# Patient Record
Sex: Female | Born: 1935 | Race: Black or African American | Hispanic: No | Smoking: Former smoker
Health system: Southern US, Community
[De-identification: ages and names within clinical notes are randomized; demographics above are authoritative.]

## PROBLEM LIST (undated history)

## (undated) DIAGNOSIS — I10 Essential (primary) hypertension: Secondary | ICD-10-CM

## (undated) DIAGNOSIS — R739 Hyperglycemia, unspecified: Secondary | ICD-10-CM

## (undated) DIAGNOSIS — I96 Gangrene, not elsewhere classified: Secondary | ICD-10-CM

## (undated) DIAGNOSIS — I739 Peripheral vascular disease, unspecified: Secondary | ICD-10-CM

## (undated) DIAGNOSIS — K5900 Constipation, unspecified: Secondary | ICD-10-CM

## (undated) DIAGNOSIS — E785 Hyperlipidemia, unspecified: Secondary | ICD-10-CM

## (undated) HISTORY — PX: JOINT REPLACEMENT: SHX530

## (undated) HISTORY — PX: MASTECTOMY: SHX3

## (undated) HISTORY — PX: TOTAL HIP ARTHROPLASTY: SHX124

---

## 2008-11-14 ENCOUNTER — Ambulatory Visit (HOSPITAL_COMMUNITY): Admission: RE | Admit: 2008-11-14 | Discharge: 2008-11-14 | Payer: Self-pay | Admitting: Orthopedic Surgery

## 2008-12-19 ENCOUNTER — Inpatient Hospital Stay (HOSPITAL_COMMUNITY): Admission: RE | Admit: 2008-12-19 | Discharge: 2008-12-21 | Payer: Self-pay | Admitting: Orthopedic Surgery

## 2010-10-15 LAB — CBC
HCT: 28.3 % — ABNORMAL LOW (ref 36.0–46.0)
HCT: 31.3 % — ABNORMAL LOW (ref 36.0–46.0)
HCT: 35.2 % — ABNORMAL LOW (ref 36.0–46.0)
Hemoglobin: 10.6 g/dL — ABNORMAL LOW (ref 12.0–15.0)
Hemoglobin: 11.9 g/dL — ABNORMAL LOW (ref 12.0–15.0)
Hemoglobin: 8.1 g/dL — ABNORMAL LOW (ref 12.0–15.0)
MCHC: 33.7 g/dL (ref 30.0–36.0)
MCHC: 34.6 g/dL (ref 30.0–36.0)
MCV: 91.3 fL (ref 78.0–100.0)
MCV: 91.2 fL (ref 78.0–100.0)
MCV: 92.4 fL (ref 78.0–100.0)
MCV: 92.9 fL (ref 78.0–100.0)
Platelets: 119 10*3/uL — ABNORMAL LOW (ref 150–400)
Platelets: 188 10*3/uL (ref 150–400)
RBC: 3.1 MIL/uL — ABNORMAL LOW (ref 3.87–5.11)
RBC: 2.53 MIL/uL — ABNORMAL LOW (ref 3.87–5.11)
RDW: 14.1 % (ref 11.5–15.5)
WBC: 6.1 10*3/uL (ref 4.0–10.5)
WBC: 6.5 10*3/uL (ref 4.0–10.5)

## 2010-10-15 LAB — COMPREHENSIVE METABOLIC PANEL
Albumin: 4.2 g/dL (ref 3.5–5.2)
Alkaline Phosphatase: 68 U/L (ref 39–117)
BUN: 14 mg/dL (ref 6–23)
CO2: 28 mEq/L (ref 19–32)
Chloride: 106 mEq/L (ref 96–112)
Creatinine, Ser: 1.03 mg/dL (ref 0.4–1.2)
GFR calc non Af Amer: 52 mL/min — ABNORMAL LOW (ref >60)
Glucose, Bld: 106 mg/dL — ABNORMAL HIGH (ref 70–99)
Potassium: 4.4 mEq/L (ref 3.5–5.1)
Total Bilirubin: 0.5 mg/dL (ref 0.3–1.2)

## 2010-10-15 LAB — CROSSMATCH

## 2010-10-15 LAB — PROTIME-INR
INR: 1 (ref 0.00–1.49)
Prothrombin Time: 13.3 seconds (ref 11.6–15.2)

## 2010-10-15 LAB — URINALYSIS, ROUTINE W REFLEX MICROSCOPIC
Bilirubin Urine: NEGATIVE
Ketones, ur: NEGATIVE mg/dL
Nitrite: NEGATIVE
Protein, ur: 30 mg/dL — AB
pH: 5.5 (ref 5.0–8.0)

## 2010-10-15 LAB — BASIC METABOLIC PANEL
CO2: 29 mEq/L (ref 19–32)
Chloride: 109 mEq/L (ref 96–112)
Chloride: 108 mEq/L (ref 96–112)
Creatinine, Ser: 0.84 mg/dL (ref 0.4–1.2)
GFR calc Af Amer: >60 mL/min (ref >60)
GFR calc Af Amer: >60 mL/min (ref >60)
GFR calc non Af Amer: >60 mL/min (ref >60)
Glucose, Bld: 122 mg/dL — ABNORMAL HIGH (ref 70–99)
Potassium: 3.7 mEq/L (ref 3.5–5.1)
Sodium: 141 mEq/L (ref 135–145)

## 2010-10-15 LAB — DIFFERENTIAL
Basophils Absolute: 0 10*3/uL (ref 0.0–0.1)
Basophils Relative: 0 % (ref 0–1)
Lymphocytes Relative: 24 % (ref 12–46)
Monocytes Absolute: 0.5 10*3/uL (ref 0.1–1.0)
Neutro Abs: 4.3 10*3/uL (ref 1.7–7.7)
Neutrophils Relative %: 66 % (ref 43–77)

## 2010-10-15 LAB — URINE MICROSCOPIC-ADD ON

## 2010-10-15 LAB — URINE CULTURE: Colony Count: 80000

## 2010-10-15 LAB — APTT: aPTT: 28 seconds (ref 24–37)

## 2010-10-16 LAB — URINE MICROSCOPIC-ADD ON

## 2010-10-16 LAB — URINE CULTURE

## 2010-10-16 LAB — URINALYSIS, ROUTINE W REFLEX MICROSCOPIC
Bilirubin Urine: NEGATIVE
Hgb urine dipstick: NEGATIVE
Specific Gravity, Urine: 1.007 (ref 1.005–1.030)
Urobilinogen, UA: 0.2 mg/dL (ref 0.0–1.0)

## 2010-11-20 NOTE — Op Note (Signed)
Lindsay Bentley, Lindsay Bentley                  ACCOUNT NO.:  [id number]   MEDICAL RECORD NO.:  [id number]          PATIENT TYPE:  INP   LOCATION:  5039                         FACILITY:  [facility]   PHYSICIAN:  Mila Homer. Sherlean Foot, M.D. DATE OF BIRTH:  [birth year]   DATE OF PROCEDURE:  12/19/2008  DATE OF DISCHARGE:                               OPERATIVE REPORT   SURGEON:  Mila Homer. Sherlean Foot, MD   ASSISTANTS:  1. Altamese Cabal, PA-C  2. Skip Mayer, PA-C   ANESTHESIA:  General.   PREOPERATIVE DIAGNOSIS:  Right hip osteoarthritis.   POSTOPERATIVE DIAGNOSIS:  Right hip osteoarthritis.   PROCEDURE:  Right total hip arthroplasty.   INDICATIONS FOR PROCEDURE:  The patient is a 77 year old African  American female with failure of conservative measures for osteoarthritis  of the right hip.  Informed consent obtained.   DESCRIPTION OF PROCEDURE:  The patient was laid supine, administered  general anesthesia, and placed in the left down, right up lateral  decubitus position.  Right leg was prepped and draped in the usual  sterile fashion.  Curvilinear incision was made with a #10 blade.  Nu-  Blade was used to incise the fascia lata along the length of the  incision.  Cautery dissection was used to obtain hemostasis.  I put a  Charnley retractor in place.  I then used the cautery to make the  incision bisecting the gluteus medius and vastus lateralis.  I then used  a 10 blade to elevate this in a single sleeve.  I placed a stay suture  in the lateralis, medius, and gluteus minimus.  I then placed a Hohmann  retractor beneath those and performed anterior hip capsulectomy with the  forceps and a Bovie.  I then externally rotated exposing the neck and  head.  I used the cutting guide to mark out the cut, made that cut with  a reciprocating saw.  I then removed the head and neck segment with the  large drill grasping device.  I then placed anterior and posterior  Hohmann retractors.  I then switched sides  of the table with my PAs.  I  removed the labrum circumferentially.  I then sequentially reamed up to  54 and placed in a 56-mm cup and placed 2 screws and I did discuss there  was weak bone and a couple of cysts that I had to pack with some reamer  bone graft.  I then placed a standard liner to receive a 32 head.  I  then went back to the back side of the table and flexed the knee and hip  placing the foot and leg into a sterile pouch off the anterior side of  the table.  I then used a Mueller retractor to deliver the cut surface  of the femoral neck up out of the wound and then sequentially reamed up  to 12 mm and broached to 12 and then trialed with 0 and -3.5 heads and  the 0 one gave excellent limb length, offset, and stability.  I then  removed trial components and placed in  a fully porous-coated size 12  stem, tamped on a clean +0 x 32-mm head and located the hip taking it  through another aggressive range of motion I had excellent stability.  I  repaired the medius minimus lateralis sleeve down to the trochanter  through drill holes and sutures.  I then closed the fascia lata with  running #1 Vicryls, deep soft tissues buried with 0 Vicryl, subcuticular  2-0 Vicryl stitch, and skin staples.  Dressed with Xeroform and Mepilex  dressing.   COMPLICATIONS:  None.   DRAINS:  None.   ESTIMATED BLOOD LOSS:  450 mL.           ______________________________  Mila Homer. Sherlean Foot, M.D.     SDL/MEDQ  D:  12/19/2008  T:  12/20/2008  Job:  [id number]

## 2010-11-23 NOTE — Discharge Summary (Signed)
Lindsay Bentley, Lindsay Bentley                  ACCOUNT NO.:  [id number]   MEDICAL RECORD NO.:  [id number]          PATIENT TYPE:  INP   LOCATION:  5039                         FACILITY:  [facility]   PHYSICIAN:  Mila Homer. Sherlean Foot, M.D. DATE OF BIRTH:  [birth year]   DATE OF ADMISSION:  12/19/2008  DATE OF DISCHARGE:  12/21/2008                               DISCHARGE SUMMARY   ADMISSION DIAGNOSIS:  Osteoarthritis of the right hip.   DISCHARGE DIAGNOSES:  1. Osteoarthritis of the right hip.  2. Status post total hip arthroplasty, right hip.  3. Acute blood loss anemia, status post surgery.   PROCEDURE:  Right total hip arthroplasty.   HISTORY:  The patient is a 77 year old female with constant severe hip  pain interfering with activities of daily living, and conservative  treatment has failed.  Risk and benefits of surgery were discussed with  the patient and the patient would like to proceed with a right THA.   ALLERGIES:  The patient was allergic to:  1. PERCOCET.  2. OXYCODONE.   On admission, the patient is taking Tylenol 1-2 a day and atenolol 50 mg  daily.   HOSPITAL COURSE:  This is a 77 year old female admitted on December 19, 2008, after appropriate laboratory studies were obtained preoperatively  as well as Ancef.  On-call to the operating room, she was taken to the  OR where she underwent a right THA.  She tolerated the procedure well  and was taken to the PACU in good condition.  Foley was placed  intraoperatively and she was placed on IV and p.o. pain medications.   On postop day #1, vital signs were stable so for.  She was little  hypotensive.  Her blood pressure medicine was held.  The patient denied  shortness of breath, chest pain or calf pain.  The patient was started  on Lovenox 30 mg subcu q.12 h. at a.m.   Consults for PT/OT, and Care Management were made.  The patient is  weightbearing as tolerated.  Incentive spirometry was taught.  The  patient's blood level had dropped  to 8.1 and was transfused 2 units.   On postop day #2, the patient has continued progress with physical  therapy.  Her H and H was up to 9.5 and 28.3.  She was asymptomatic.  Foley was discontinued.  The patient was continued on pain medication.  The patient was discharged home after Lovenox teaching.   LABORATORY STUDIES:  Upon admission to the hospital, the patient's white  blood cell count was 6.5, H and H was 11.9 and 35.2, and platelets are  188.  Sodium was 138, potassium was 4.4, chloride was 106, CO2 was 28,  glucose was 106, BUN was 14, and creatinine was 1.02.  Upon discharge;  white blood cell count was 6.1, H and H was 9.5 and 28.3, and 119 of  platelets.  Sodium was 139, potassium was 3.7, chloride was 109, CO2 was  25, glucose was 129, BUN was 11, and creatinine was 0.84.   DISCHARGE INSTRUCTIONS:  No restrictions to diet.  She  is to follow the  blue instruction sheet.  For wound care, she is to increase activity  slowly.  Use a cane or a walker, weightbearing as tolerated.  No lifting  or driving for 6 weeks.  She is to have home health.   DISCHARGE MEDICATIONS:  Upon discharge from the hospital, she was given  prescriptions for:  1. Lovenox 40 mg inject one subcu daily, last dose on January 02, 2009.  2. Robaxin 500 mg 1-2 tablets every 6-8 hours as needed for spasm.  3. Oxycodone 5 mg 1-2 tablets every 4-6 hours as needed for pain.   FOLLOWUP:  She is to follow up in 2 weeks with Dr. Sherlean Foot on either in  [place] on January 02, 2009, or in [place] on January 03, 2009.  She is  to call for appointment, either [phone] or [phone].   The patient was discharged in improved condition.     ______________________________  Altamese Cabal, PA-C    ______________________________  Mila Homer. Sherlean Foot, M.D.    MJ/MEDQ  D:  02/03/2009  T:  02/04/2009  Job:  [id number]

## 2013-04-02 ENCOUNTER — Inpatient Hospital Stay (HOSPITAL_COMMUNITY)
Admission: EM | Admit: 2013-04-02 | Discharge: 2013-04-08 | DRG: 300 | Disposition: A | Discharge status: Skilled Nursing Facility | Payer: Medicare PPO | Attending: Internal Medicine | Admitting: Internal Medicine

## 2013-04-02 ENCOUNTER — Emergency Department (HOSPITAL_COMMUNITY): Payer: Medicare PPO

## 2013-04-02 ENCOUNTER — Encounter (HOSPITAL_COMMUNITY): Payer: Self-pay | Admitting: Emergency Medicine

## 2013-04-02 DIAGNOSIS — I10 Essential (primary) hypertension: Secondary | ICD-10-CM | POA: Diagnosis present

## 2013-04-02 DIAGNOSIS — F17200 Nicotine dependence, unspecified, uncomplicated: Secondary | ICD-10-CM | POA: Diagnosis present

## 2013-04-02 DIAGNOSIS — I739 Peripheral vascular disease, unspecified: Secondary | ICD-10-CM | POA: Diagnosis present

## 2013-04-02 DIAGNOSIS — I96 Gangrene, not elsewhere classified: Principal | ICD-10-CM | POA: Diagnosis present

## 2013-04-02 DIAGNOSIS — L02212 Cutaneous abscess of back [any part, except buttock]: Secondary | ICD-10-CM

## 2013-04-02 DIAGNOSIS — K5900 Constipation, unspecified: Secondary | ICD-10-CM | POA: Diagnosis present

## 2013-04-02 DIAGNOSIS — Z87440 Personal history of urinary (tract) infections: Secondary | ICD-10-CM

## 2013-04-02 DIAGNOSIS — Z96649 Presence of unspecified artificial hip joint: Secondary | ICD-10-CM

## 2013-04-02 DIAGNOSIS — Z720 Tobacco use: Secondary | ICD-10-CM | POA: Diagnosis present

## 2013-04-02 DIAGNOSIS — L97519 Non-pressure chronic ulcer of other part of right foot with unspecified severity: Secondary | ICD-10-CM

## 2013-04-02 DIAGNOSIS — L02219 Cutaneous abscess of trunk, unspecified: Secondary | ICD-10-CM | POA: Diagnosis present

## 2013-04-02 HISTORY — DX: Essential (primary) hypertension: I10

## 2013-04-02 HISTORY — DX: Gangrene, not elsewhere classified: I96

## 2013-04-02 LAB — URINALYSIS, ROUTINE W REFLEX MICROSCOPIC
Bilirubin Urine: NEGATIVE
Glucose, UA: NEGATIVE mg/dL
Hgb urine dipstick: NEGATIVE
Ketones, ur: NEGATIVE mg/dL
Nitrite: NEGATIVE
Protein, ur: 100 mg/dL — AB
Specific Gravity, Urine: 1.012 (ref 1.005–1.030)
Urobilinogen, UA: 0.2 mg/dL (ref 0.0–1.0)

## 2013-04-02 LAB — CBC WITH DIFFERENTIAL/PLATELET
Basophils Relative: 0 % (ref 0–1)
Eosinophils Absolute: 0.2 10*3/uL (ref 0.0–0.7)
Eosinophils Relative: 2 % (ref 0–5)
HCT: 32.6 % — ABNORMAL LOW (ref 36.0–46.0)
Hemoglobin: 10.6 g/dL — ABNORMAL LOW (ref 12.0–15.0)
Lymphocytes Relative: 15 % (ref 12–46)
Lymphs Abs: 1.1 10*3/uL (ref 0.7–4.0)
MCH: 30.2 pg (ref 26.0–34.0)
MCHC: 32.5 g/dL (ref 30.0–36.0)
MCV: 92.9 fL (ref 78.0–100.0)
Monocytes Absolute: 0.6 10*3/uL (ref 0.1–1.0)
Monocytes Relative: 9 % (ref 3–12)
Neutrophils Relative %: 75 % (ref 43–77)
Platelets: 178 10*3/uL (ref 150–400)
RBC: 3.51 MIL/uL — ABNORMAL LOW (ref 3.87–5.11)

## 2013-04-02 LAB — POCT I-STAT, CHEM 8
BUN: 19 mg/dL (ref 6–23)
Calcium, Ion: 1.3 mmol/L (ref 1.13–1.30)
Creatinine, Ser: 1.1 mg/dL (ref 0.50–1.10)
TCO2: 27 mmol/L (ref 0–100)

## 2013-04-02 LAB — URINE MICROSCOPIC-ADD ON

## 2013-04-02 MED ORDER — ACETAMINOPHEN 325 MG PO TABS: 650.0000 mg | ORAL_TABLET | Freq: Four times a day (QID) | ORAL | Status: DC | PRN

## 2013-04-02 MED ORDER — ATENOLOL 25 MG PO TABS
25.0000 mg | ORAL_TABLET | Freq: Every day | ORAL | Status: DC
  Administered 2013-04-02 – 2013-04-08 (×7): 25 mg via ORAL
  Filled 2013-04-02 (×8): qty 1

## 2013-04-02 MED ORDER — ASPIRIN EC 81 MG PO TBEC
81.0000 mg | DELAYED_RELEASE_TABLET | Freq: Every day | ORAL | Status: DC
  Administered 2013-04-02 – 2013-04-08 (×7): 81 mg via ORAL
  Filled 2013-04-02 (×8): qty 1

## 2013-04-02 MED ORDER — INFLUENZA VAC SPLIT QUAD 0.5 ML IM SUSP
0.5000 mL | INTRAMUSCULAR | Status: AC
  Administered 2013-04-03: 12:00:00 0.5 mL via INTRAMUSCULAR
  Filled 2013-04-02: qty 0.5

## 2013-04-02 MED ORDER — ACETAMINOPHEN 650 MG RE SUPP: 650.0000 mg | Freq: Four times a day (QID) | RECTAL | Status: DC | PRN

## 2013-04-02 MED ORDER — NITROGLYCERIN 0.4 MG SL SUBL: 0.4000 mg | SUBLINGUAL_TABLET | SUBLINGUAL | Status: DC | PRN

## 2013-04-02 MED ORDER — ENOXAPARIN SODIUM 40 MG/0.4ML ~~LOC~~ SOLN
40.0000 mg | SUBCUTANEOUS | Status: DC
  Administered 2013-04-02 – 2013-04-05 (×4): 40 mg via SUBCUTANEOUS
  Filled 2013-04-02 (×6): qty 0.4

## 2013-04-02 NOTE — ED Notes (Signed)
PCP this am for evaluation of necrotic right foot and cyst on back. Sent to ED for same, NAD. PCP stated no circulation in right toes

## 2013-04-02 NOTE — Progress Notes (Signed)
04/02/13 Patient admitted from ED to Rm 5w08 with dx of foot ulcers no order written call placed to physician at 1630. Made comfortable in room with daughter assisting

## 2013-04-02 NOTE — ED Provider Notes (Signed)
CSN: [id number]     Arrival date & time 04/02/13  1129 History   First MD Initiated Contact with Patient 04/02/13 1234     Chief Complaint  Patient presents with  . Foot Injury  . cyst drainage    (Consider location/radiation/quality/duration/timing/severity/associated sxs/prior Treatment) Patient is a 79 y.o. female presenting with foot injury. The history is provided by the patient.  Foot Injury Associated symptoms: no back pain    patient was sent in by her primary care Dr. for infection on her right foot. Patient said his been going on for a while. She was seen 2 weeks ago for the same thing. She also had a cyst on her back that has been incised and drained with packing. Packing was removed today. She denies fever. She has chills. Patient states she was told there was no blood flow in her foot. She's also been on Cipro for urinary tract infection.  Past Medical History  Diagnosis Date  . Hypertension   . Gangrene of foot    Past Surgical History  Procedure Laterality Date  . Total hip arthroplasty Right   . Mastectomy Bilateral   . Joint replacement     History reviewed. No pertinent family history. History  Substance Use Topics  . Smoking status: Heavy Tobacco Smoker -- 0.50 packs/day for 50 years    Types: Cigarettes  . Smokeless tobacco: Never Used  . Alcohol Use: No   OB History   Grav Para Term Preterm Abortions TAB SAB Ect Mult Living                 Review of Systems  Constitutional: Negative for activity change and appetite change.  HENT: Negative for neck stiffness.   Eyes: Negative for pain.  Respiratory: Negative for chest tightness and shortness of breath.   Cardiovascular: Negative for chest pain and leg swelling.  Gastrointestinal: Negative for nausea, vomiting, abdominal pain and diarrhea.  Genitourinary: Negative for flank pain.  Musculoskeletal: Negative for back pain.  Skin: Positive for wound. Negative for rash.  Neurological: Negative for  weakness, numbness and headaches.  Psychiatric/Behavioral: Negative for behavioral problems.    Allergies  Review of patient's allergies indicates no known allergies.  Home Medications  No current outpatient prescriptions on file. BP 140/57  Pulse 69  Temp(Src) 99 F (37.2 C) (Oral)  Resp 18  Ht 5\' 3"  (1.6 m)  Wt 148 lb 9.4 oz (67.4 kg)  BMI 26.33 kg/m2  SpO2 96% Physical Exam  Nursing note and vitals reviewed. Constitutional: She is oriented to person, place, and time. She appears well-developed and well-nourished.  HENT:  Head: Normocephalic and atraumatic.  Eyes: EOM are normal. Pupils are equal, round, and reactive to light.  Neck: Normal range of motion. Neck supple.  Cardiovascular: Normal rate, regular rhythm and normal heart sounds.   No murmur heard. Pulmonary/Chest: Effort normal and breath sounds normal. No respiratory distress. She has no wheezes. She has no rales.  Abdominal: Soft. Bowel sounds are normal. She exhibits no distension. There is no tenderness. There is no rebound and no guarding.  Musculoskeletal: Normal range of motion.  Large ulcers to right third and fourth toe. Decreased sensation of fourth toe. Some edema of foot and lower leg. There is a palpable, but weak dorsalis pedis pulse. There is a draining cyst with purulent drainage on her left upper back. It is approximately 5 cm in  Neurological: She is alert and oriented to person, place, and time. No cranial nerve  deficit.  Skin: Skin is warm and dry.  Psychiatric: She has a normal mood and affect. Her speech is normal.    ED Course  Procedures (including critical care time) Labs Review Labs Reviewed  CBC WITH DIFFERENTIAL - Abnormal; Notable for the following:    RBC 3.51 (*)    Hemoglobin 10.6 (*)    HCT 32.6 (*)    All other components within normal limits  URINALYSIS, ROUTINE W REFLEX MICROSCOPIC - Abnormal; Notable for the following:    Protein, ur 100 (*)    Leukocytes, UA MODERATE  (*)    All other components within normal limits  POCT I-STAT, CHEM 8 - Abnormal; Notable for the following:    Glucose, Bld 220 (*)    Hemoglobin 11.6 (*)    HCT 34.0 (*)    All other components within normal limits  URINE CULTURE  URINE MICROSCOPIC-ADD ON  COMPREHENSIVE METABOLIC PANEL   Imaging Review Dg Foot Complete Right  04/02/2013   CLINICAL DATA:  Foot injury. Non healing wound between 3rd and 4th toes  EXAM: RIGHT FOOT COMPLETE - 3+ VIEW  COMPARISON:  None.  FINDINGS: Normal alignment. Negative for fracture. No evidence of osteomyelitis.  IMPRESSION: No acute abnormality.   Electronically Signed   By: Marlan Palau M.D.   On: 04/02/2013 12:50    MDM   1. Foot ulcer, right, with unspecified severity   2. Back abscess    Patient with foot ulcers. Does have pulse in the foot but will need further evaluation. Also an abscess on the back. Has been on antibiotics. Will admit to    [facility]. Rubin Payor, MD 04/02/13 2056

## 2013-04-02 NOTE — H&P (Signed)
Date: 04/03/2013               Patient Name:  Lindsay Bentley MRN: [id number]  DOB: [birth year] Age / Sex: 79 y.o., female   PCP: Abigail Miyamoto, MD         Medical Service: Internal Medicine Teaching Service         Attending Physician: Dr. Dalphine Handing    First Contact: Dr. Claudell Kyle Pager: [phone]  Second Contact: Dr. Dorise Hiss Pager: [phone]       After Hours (After 5p/  First Contact Pager: [phone]  weekends / holidays): Second Contact Pager: [phone]   Chief Complaint: right foot "wounds"  History of Present Illness:  Lindsay Bentley is a 79 year old woman with history of back abscess s/p recent I&D by PCP, HTN, HL, tobacco abuse who presents from PCP (Dr. Marina Goodell) office with "wounds" on right 3rd and 4th toes x 2 weeks and concern for ischemia.   Patient states that about two weeks ago she starting noticing that some of the toes on her right foot became dark and bloody.  She has not noticed any puss or foul smell, denies fever. She went to see her PCP today who told her he was worried there wasn't enough blood flow to her feet.   She also had an abscess on her upper left back s/p I&D, culture pending.  She has ?completed course of abx. She gets her medications in the mail [facility].    She smokes 1/2 PPD, no alcohol or illicits.  She lives at home with her granddaughter and two great-granddaughters.    On ROS, denies fever/chills, lightheadedness, numbness/tingling. She endorses chronic constipation- last BM on 9/25, no blood.  Denies dysuria, frequency, hematuria; she was recently treated for UTI with cipro.  Her legs have also been cramping some when she is trying to sleep.    Meds: Current Facility-Administered Medications  Medication Dose Route Frequency Provider Last Rate Last Dose  . acetaminophen (TYLENOL) tablet 650 mg  650 mg Oral Q6H PRN Annett Gula, MD       Or  . acetaminophen (TYLENOL) suppository 650 mg  650 mg Rectal Q6H PRN Annett Gula, MD      . aspirin EC  tablet 81 mg  81 mg Oral Daily Annett Gula, MD   81 mg at 04/02/13 2327  . atenolol (TENORMIN) tablet 25 mg  25 mg Oral Daily Annett Gula, MD   25 mg at 04/02/13 2329  . enoxaparin (LOVENOX) injection 40 mg  40 mg Subcutaneous Q24H Annett Gula, MD   40 mg at 04/02/13 2329  . influenza vac split quadrivalent PF (FLUARIX) injection 0.5 mL  0.5 mL Intramuscular Tomorrow-1000 Inez Catalina, MD      . nitroGLYCERIN (NITROSTAT) SL tablet 0.4 mg  0.4 mg Sublingual Q5 min PRN Annett Gula, MD      . polyethylene glycol (MIRALAX / GLYCOLAX) packet 17 g  17 g Oral Daily PRN Annett Gula, MD        Allergies: Allergies as of 04/02/2013  . (No Known Allergies)   Past Medical History  Diagnosis Date  . Hypertension   . Gangrene of foot    Past Surgical History  Procedure Laterality Date  . Total hip arthroplasty Right   . Mastectomy Bilateral   . Joint replacement     History reviewed. No pertinent family history. History   Social History  . Marital Status: Widowed  Spouse Name: N/A    Number of Children: N/A  . Years of Education: N/A   Occupational History  . Not on file.   Social History Main Topics  . Smoking status: Heavy Tobacco Smoker -- 0.50 packs/day for 50 years    Types: Cigarettes  . Smokeless tobacco: Never Used  . Alcohol Use: No  . Drug Use: No  . Sexual Activity: No   Other Topics Concern  . Not on file   Social History Narrative  . No narrative on file    Review of Systems: Review of Systems  Constitutional: Negative for fever, chills and malaise/fatigue.  HENT: Negative for sore throat.   Eyes: Negative for blurred vision.  Respiratory: Negative for cough and shortness of breath.   Cardiovascular: Negative for chest pain, palpitations and leg swelling.  Gastrointestinal: Positive for constipation. Negative for nausea, vomiting, abdominal pain and blood in stool.  Genitourinary: Negative for dysuria.  Musculoskeletal: Positive for joint  pain. Negative for falls.  Skin: Negative for rash.  Neurological: Negative for dizziness, tingling, focal weakness, weakness and headaches.    Physical Exam: Blood pressure 140/57, pulse 69, temperature 99 F (37.2 C), temperature source Oral, resp. rate 18, height 5\' 3"  (1.6 m), weight 148 lb 9.4 oz (67.4 kg), SpO2 96.00%. General: alert, cooperative, and in no apparent distress HEENT: NCAT, pupils equal round and reactive to light, vision grossly intact, oropharynx clear and non-erythematous  Neck: supple, no lymphadenopathy, JVD, or carotid bruits Lungs: clear to ascultation bilaterally, normal work of respiration, no wheezes, rales, ronchi Back: upper left back abscess now covered with wound care bandage Heart: regular rate and rhythm, no murmurs, gallops, or rubs Abdomen: soft, non-tender, non-distended, normal bowel sounds Extremities: large wounds on distal 3rd and 4th toes of right foot, bloody, foul smelling, small amount of purulent drainage, some edema on dorsal foot with shiny skin but no erythema; no wounds or skin breakdown appreciated on left foot; warm to touch bilaterally, 1+ DP pulse on left, ?palpable DP pulse on right, hair loss on bilateral feet, no cyanosis, clubbing Neurologic: alert & oriented X3, cranial nerves II-XII intact, strength grossly intact, sensation intact to light touch   Lab results: Basic Metabolic Panel:  Recent Labs  40/98/11 1221  NA 140  K 4.4  CL 103  GLUCOSE 220*  BUN 19  CREATININE 1.10   CBC:  Recent Labs  04/02/13 1221 04/02/13 1250  WBC  --  7.2  NEUTROABS  --  5.3  HGB 11.6* 10.6*  HCT 34.0* 32.6*  MCV  --  92.9  PLT  --  178   Urinalysis:  Recent Labs  04/02/13 1407  COLORURINE YELLOW  LABSPEC 1.012  PHURINE 6.5  GLUCOSEU NEGATIVE  HGBUR NEGATIVE  BILIRUBINUR NEGATIVE  KETONESUR NEGATIVE  PROTEINUR 100*  UROBILINOGEN 0.2  NITRITE NEGATIVE  LEUKOCYTESUR MODERATE*    Imaging results:  Dg Foot Complete  Right  04/02/2013   CLINICAL DATA:  Foot injury. Non healing wound between 3rd and 4th toes  EXAM: RIGHT FOOT COMPLETE - 3+ VIEW  COMPARISON:  None.  FINDINGS: Normal alignment. Negative for fracture. No evidence of osteomyelitis.  IMPRESSION: No acute abnormality.   Electronically Signed   By: Marlan Palau M.D.   On: 04/02/2013 12:50    Assessment & Plan by Problem: #Gangrene of right 3rd and 4th toes- appears to be wet gangrene with blood and ?puss seen on exam.  Xray of right foot showed no fracture or evidence  of osteomyelitis.  Pt did not meet any SIRS criteria at admission (no fever, tachycardia, tachypnea, or leucocytosis (WBC 7.2)); could consider initiation of abx in AM or after vascular consult. Pt currently lives with her granddaughter who helps care for her but may need placement at SNF for assistance.  -consult to vascular surgery  -neurovascular checks q4h -acetominophen prn pain -wound care consult -social work consult -PT/OT consult -A1C, CMP in AM  #History of abscess on upper left back- s/p I&D with PCP, culture pending.  Packing reportedly removed at 9/25 PCP visit.  No erythema appreciated.   -wound care consult -continue to monitor  #Possible UTI- Pt recently treated for UTI with cipro (time course unclear).  9/26 UA with moderate leukesterase.  Denies fever, dysuria.  -urine culture pending  #HTN- stable BP 140s/50s -continue home meds (atenolol)  #HL- on pravastatin at home  #Tobacco abuse- declined patch -smoking cessation counseling  #DVT PPX- lovenox  #Code status- Full code  Dispo: Disposition is deferred at this time, awaiting improvement of current medical problems. Anticipated discharge in approximately 2-3 day(s).   The patient does have a current PCP Abigail Miyamoto, MD) and does need an [facility] hospital follow-up appointment after discharge.   Signed: Rocco Serene, MD 04/03/2013, 1:36 AM

## 2013-04-03 DIAGNOSIS — I70269 Atherosclerosis of native arteries of extremities with gangrene, unspecified extremity: Secondary | ICD-10-CM

## 2013-04-03 LAB — COMPREHENSIVE METABOLIC PANEL
Albumin: 3.3 g/dL — ABNORMAL LOW (ref 3.5–5.2)
Alkaline Phosphatase: 63 U/L (ref 39–117)
BUN: 14 mg/dL (ref 6–23)
CO2: 25 mEq/L (ref 19–32)
Chloride: 102 mEq/L (ref 96–112)
GFR calc Af Amer: 56 mL/min — ABNORMAL LOW (ref >90)
GFR calc non Af Amer: 48 mL/min — ABNORMAL LOW (ref >90)
Glucose, Bld: 94 mg/dL (ref 70–99)
Potassium: 4.3 mEq/L (ref 3.5–5.1)
Total Bilirubin: 0.4 mg/dL (ref 0.3–1.2)

## 2013-04-03 LAB — HEMOGLOBIN A1C
Hgb A1c MFr Bld: 6.3 % — ABNORMAL HIGH (ref <5.7)
Mean Plasma Glucose: 134 mg/dL — ABNORMAL HIGH (ref <117)

## 2013-04-03 MED ORDER — NICOTINE 14 MG/24HR TD PT24
14.0000 mg | MEDICATED_PATCH | Freq: Every day | TRANSDERMAL | Status: DC
  Administered 2013-04-03 – 2013-04-08 (×6): 14 mg via TRANSDERMAL
  Filled 2013-04-03 (×6): qty 1

## 2013-04-03 MED ORDER — SENNOSIDES-DOCUSATE SODIUM 8.6-50 MG PO TABS
1.0000 | ORAL_TABLET | Freq: Two times a day (BID) | ORAL | Status: DC
  Administered 2013-04-03 – 2013-04-04 (×4): 1 via ORAL
  Filled 2013-04-03 (×3): qty 1

## 2013-04-03 MED ORDER — HYDROCODONE-ACETAMINOPHEN 5-325 MG PO TABS
1.0000 | ORAL_TABLET | ORAL | Status: DC | PRN
  Administered 2013-04-03 – 2013-04-05 (×5): 1 via ORAL
  Filled 2013-04-03 (×5): qty 1

## 2013-04-03 MED ORDER — SODIUM CHLORIDE 0.9 % IJ SOLN
3.0000 mL | Freq: Two times a day (BID) | INTRAMUSCULAR | Status: DC
  Administered 2013-04-03 – 2013-04-06 (×5): 3 mL via INTRAVENOUS

## 2013-04-03 MED ORDER — POLYETHYLENE GLYCOL 3350 17 G PO PACK
17.0000 g | PACK | Freq: Every day | ORAL | Status: DC | PRN
  Filled 2013-04-03: qty 1

## 2013-04-03 MED ORDER — VANCOMYCIN HCL IN DEXTROSE 1-5 GM/200ML-% IV SOLN
1000.0000 mg | INTRAVENOUS | Status: DC
  Administered 2013-04-03 – 2013-04-06 (×4): 1000 mg via INTRAVENOUS
  Filled 2013-04-03 (×7): qty 200

## 2013-04-03 NOTE — Progress Notes (Signed)
ANTIBIOTIC CONSULT NOTE - INITIAL  Pharmacy Consult for Vancomycin Indication: Right foot wounds  No Known Allergies  Patient Measurements: Height: 5\' 3"  (160 cm) Weight: 148 lb 9.4 oz (67.4 kg) IBW/kg (Calculated) : 52.4  Vital Signs: Temp: 99.8 F (37.7 C) (09/27 0529) Temp src: Oral (09/27 0529) BP: 112/62 mmHg (09/27 0529) Pulse Rate: 60 (09/27 0529) Intake/Output from previous day: 09/26 0701 - 09/27 0700 In: 240 [P.O.:240] Out: -   Labs:  Recent Labs  04/02/13 1221 04/02/13 1250 04/03/13 0544  WBC  --  7.2  --   HGB 11.6* 10.6*  --   PLT  --  178  --   CREATININE 1.10  --  1.05   Estimated Creatinine Clearance: 38.1 ml/min (by C-G formula based on Cr of 1.05). No results found for this basename: VANCOTROUGH, VANCOPEAK, VANCORANDOM, GENTTROUGH, GENTPEAK, GENTRANDOM, TOBRATROUGH, TOBRAPEAK, TOBRARND, AMIKACINPEAK, AMIKACINTROU, AMIKACIN,  in the last 72 hours   Microbiology: No results found for this or any previous visit (from the past 720 hour(s)).  Medical History: Past Medical History  Diagnosis Date  . Hypertension   . Gangrene of foot     Medications:  Prescriptions prior to admission  Medication Sig Dispense Refill  . aspirin 81 MG tablet Take 81 mg by mouth daily.      Marland Kitchen atenolol (TENORMIN) 25 MG tablet Take 25 mg by mouth daily.      . nitroGLYCERIN (NITROSTAT) 0.4 MG SL tablet Place 0.4 mg under the tongue every 5 (five) minutes as needed for chest pain.      . pravastatin (PRAVACHOL) 10 MG tablet Take 10 mg by mouth daily.       Assessment: 79 yo F with h/o back abscess s/p recent I&D, HTN, HLD, tobacco abuse presenting on 9/26 with "wounds" on right 3rd and 4th toes x 2 weeks and concern for ischemia.  Pharmacy consulted to start vancomycin. Xray showed no signs of OM, does not meet SIRS criteria. Afebrile, WBC wnl (7.2), SCr 1.05, CrCl ~44   Goal of Therapy:  Vancomycin trough level 10-15 mcg/ml  Plan:  - Vanc 1g IV q24h - f/u temp,  WBC, C&S, levels prn   Margie Billet, PharmD Clinical Pharmacist - Resident Pager: [phone] Pharmacy: [phone] 04/03/2013 8:54 AM

## 2013-04-03 NOTE — H&P (Signed)
I saw and evaluated the patient.  I personally confirmed the key portions of the history and exam documented by Dr. Aundria Rud and Dr. Dorise Hiss and I reviewed pertinent patient test results.  The assessment, diagnosis, and plan were formulated together, with my additions below, and I agree with the documentation in the resident's note.   In brief, this is an 79 year old woman with history of hypertension, hyperlipidemia, who has recently had a back abscess drained and presented with gangrenous right foot 3rd and 4th toes for the past 2-3 weeks, with no history of claudication. She reports considerable pain in that feet. She denies chest pain, palpitations, fever, or chills, numbness or tingling in feet. She is a current smoker.   On exam - her right foot wound affects the 3rd and 4th fingers and the interdigital space. The wound is black in color, with very little putrid fowl smelling discharge. It is not deep. Macerated skin surrounds the wound. There is no evidence of cellulitis. Could not feel pedal pulses bilaterally. Blood work has been reviewed and does not show any signs of systemic infection. X Ray reviewed.   Right now the patient is being treated with Vancomycin IV for probable osteomyelitis, we are trying to give adequate pain control with opiates, Vascular Surgery has been consulted and they have advised conservative management and limb salvage, with toe amputaion. We have discussed smoking cessation with the patient. We will obtain a lipid panel and ABI as per vascular surgery suggestion.

## 2013-04-03 NOTE — Progress Notes (Signed)
Subjective: The patient is a very pleasant 79 YO woman who came in to the hospital with toe injury. Her daughter adds that the injury happened about 2-3 weeks ago although the cause is uncertain. She has had no improvement of the area and it has worsened. They have not noticed any discharge at home. She is not having any complaints this morning except mild pain in her foot. She denies chest pain, fevers, SOB, nausea, vomiting. She has last moved her bowels Tuesday. She is moving gas but would like something to help that out. She was supposed to get prune juice last night but did not get it.   Objective: Vital signs in last 24 hours: Filed Vitals:   04/02/13 1632 04/02/13 1642 04/03/13 0529 04/03/13 0936  BP: 140/57  112/62 122/67  Pulse: 69  60 64  Temp: 99 F (37.2 C)  99.8 F (37.7 C)   TempSrc: Oral  Oral   Resp: 18  16   Height:  5\' 3"  (1.6 m)    Weight:  148 lb 9.4 oz (67.4 kg)    SpO2: 96%  97%    Weight change:   Intake/Output Summary (Last 24 hours) at 04/03/13 1021 Last data filed at 04/02/13 1842  Gross per 24 hour  Intake    240 ml  Output      0 ml  Net    240 ml   General: resting in bed, comfortable HEENT: PERRL, EOMI, no scleral icterus Cardiac: S1 S2 normal Pulm: clear to auscultation bilaterally, moving normal volumes of air Abd: soft, nontender, nondistended, BS present Ext: warm with wound on 3/4th toes on the right foot with necrotic tissue and some minimally purulent area where the interdigitation occurs. No surrounding erythema or warmth, minimally palpable pulse on the left foot DP but no to minimal pulse on the right DP Neuro: alert and oriented X3, cranial nerves II-XII grossly intact  Lab Results: Basic Metabolic Panel:  Recent Labs Lab 04/02/13 1221 04/03/13 0544  NA 140 137  K 4.4 4.3  CL 103 102  CO2  --  25  GLUCOSE 220* 94  BUN 19 14  CREATININE 1.10 1.05  CALCIUM  --  9.7   Liver Function Tests:  Recent Labs Lab 04/03/13 0544    AST 16  ALT 8  ALKPHOS 63  BILITOT 0.4  PROT 6.7  ALBUMIN 3.3*   CBC:  Recent Labs Lab 04/02/13 1221 04/02/13 1250  WBC  --  7.2  NEUTROABS  --  5.3  HGB 11.6* 10.6*  HCT 34.0* 32.6*  MCV  --  92.9  PLT  --  178   Urinalysis:  Recent Labs Lab 04/02/13 1407  COLORURINE YELLOW  LABSPEC 1.012  PHURINE 6.5  GLUCOSEU NEGATIVE  HGBUR NEGATIVE  BILIRUBINUR NEGATIVE  KETONESUR NEGATIVE  PROTEINUR 100*  UROBILINOGEN 0.2  NITRITE NEGATIVE  LEUKOCYTESUR MODERATE*   Studies/Results: Dg Foot Complete Right  04/02/2013   CLINICAL DATA:  Foot injury. Non healing wound between 3rd and 4th toes  EXAM: RIGHT FOOT COMPLETE - 3+ VIEW  COMPARISON:  None.  FINDINGS: Normal alignment. Negative for fracture. No evidence of osteomyelitis.  IMPRESSION: No acute abnormality.   Electronically Signed   By: Marlan Palau M.D.   On: 04/02/2013 12:50   Medications: I have reviewed the patient's current medications. Scheduled Meds: . aspirin EC  81 mg Oral Daily  . atenolol  25 mg Oral Daily  . enoxaparin (LOVENOX) injection  40 mg  Subcutaneous Q24H  . influenza vac split quadrivalent PF  0.5 mL Intramuscular Tomorrow-1000  . vancomycin  1,000 mg Intravenous Q24H   Continuous Infusions:  PRN Meds:.HYDROcodone-acetaminophen, nitroGLYCERIN, polyethylene glycol Assessment/Plan:  Necrotic wound - Per vascular surgery likely converting to dry gangrene. She likely has PVD and awaiting ABI. They are contemplating cosmetic sparing procedure however they would like to weight risks and benefits. She will likely need a surgical intervention and agree that the problem likely affects the bone. Will continue Vancomycin although if diabetic (HgA1c pending but glucose 220 on admit) then may need to broaden to include anaerobes.  -Vancomycin IV -Vascular surgery considering options -Await ABI -Continue to use doppler to auscultate the pulses and watch for any changes acutely  Constipation - Last BM  Tuesday per patient and will order senokot-S. She is still moving gas and having good bowel sounds.  -Senokot S and will up titrate as needed for bowel movement  Tobacco abuse - Patient declines nicotine patch at this time. Advised her that smoking can hurt the chances of wounds healing and it is in her best interest to quit at this time. She is contemplating this.   DVT ppx - lovenox  Dispo: Disposition is deferred at this time, awaiting improvement of current medical problems.  Anticipated discharge in approximately 2-3 day(s).   The patient does have a current PCP Abigail Miyamoto, MD) and does not need an [facility] hospital follow-up appointment after discharge.  The patient does not have transportation limitations that hinder transportation to clinic appointments.  .Services Needed at time of discharge: Y = Yes, Blank = No PT:   OT:   RN:   Equipment:   Other:     LOS: 1 day   Judie Bonus, MD 04/03/2013, 10:21 AM

## 2013-04-03 NOTE — Consult Note (Addendum)
[facility]  Referred by:  Internal Medicine Teaching Service  Reason for referral: right foot gangrene  History of Present Illness  Lindsay Bentley is a 79 y.o. (Feb 25, 1928) female who presents with chief complaint: right foot pain.  Onset of symptom occurred 2-3 weeks ago.  Pt noted some pain right toes and then drainage in the right foot.  The patient denies a history of intermittent claudication or rest pain.  Per the patient's daughter, pt ambulates minimally, mainly around the house.  Pain is described as aching, severity 3-6/10, and associated with manipulating the toes.  Patient has attempted to treat this pain with rest.  The patient has blackened macerated in her toes.  Atherosclerotic risk factors include: hypertension, long standing smoking.  Past Medical History  Diagnosis Date  . Hypertension   . Gangrene of foot     Past Surgical History  Procedure Laterality Date  . Total hip arthroplasty Right   . Mastectomy Bilateral   . Joint replacement      History   Social History  . Marital Status: Widowed    Spouse Name: N/A    Number of Children: N/A  . Years of Education: N/A   Occupational History  . Not on file.   Social History Main Topics  . Smoking status: Heavy Tobacco Smoker -- 0.50 packs/day for 50 years    Types: Cigarettes  . Smokeless tobacco: Never Used  . Alcohol Use: No  . Drug Use: No  . Sexual Activity: No   Other Topics Concern  . Not on file   Social History Narrative  . No narrative on file   FamHx: neither mother or father had accelerated atherosclerosis  No current facility-administered medications on file prior to encounter.   No current outpatient prescriptions on file prior to encounter.   No Known Allergies  REVIEW OF SYSTEMS:  (Positives checked otherwise negative)  CARDIOVASCULAR:  []  chest pain, []  chest pressure, []  palpitations, []  shortness of breath when laying flat, []  shortness of breath with  exertion,  []  pain in feet when walking, []  pain in feet when laying flat, []  history of blood clot in veins (DVT), []  history of phlebitis, []  swelling in legs, []  varicose veins  PULMONARY:  []  productive cough, []  asthma, []  wheezing  NEUROLOGIC:  []  weakness in arms or legs, []  numbness in arms or legs, []  difficulty speaking or slurred speech, []  temporary loss of vision in one eye, []  dizziness  HEMATOLOGIC:  []  bleeding problems, []  problems with blood clotting too easily  MUSCULOSKEL:  []  joint pain, []  joint swelling  GASTROINTEST:  []  vomiting blood, []  blood in stool     GENITOURINARY:  []  burning with urination, []  blood in urine  PSYCHIATRIC:  []  history of major depression  INTEGUMENTARY:  []  rashes, [x]  ulcers in R foot  CONSTITUTIONAL:  []  fever, []  chills  For VQI Use Only  PRE-ADM LIVING: Home  AMB STATUS: Ambulatory  CAD Sx: None  PRIOR CHF: None  STRESS TEST: [x]  No, [ ]  Normal, [ ]  + ischemia, [ ]  + MI, [ ]  Both  Physical Examination  Filed Vitals:   04/02/13 1555 04/02/13 1632 04/02/13 1642 04/03/13 0529  BP: 135/63 140/57  112/62  Pulse: 73 69  60  Temp:  99 F (37.2 C)  99.8 F (37.7 C)  TempSrc:  Oral  Oral  Resp:  18  16  Height:   5\' 3"  (1.6 m)  Weight:   148 lb 9.4 oz (67.4 kg)   SpO2: 95% 96%  97%   Body mass index is 26.33 kg/(m^2).  General: A&O x 3, WD, elderly  Head: [place]/AT  Ear/Nose/Throat: Hearing grossly intact, nares w/o erythema or drainage, oropharynx w/o Erythema/Exudate, Mallampati score: 3   Eyes: PERRLA, EOMI, arcus senilus  Neck: Supple, no nuchal rigidity, no palpable LAD  Pulmonary: Sym exp, good air movt, CTAB, no rales, rhonchi, & wheezing   Cardiac: RRR, Nl S1, S2, no Murmurs, rubs or gallops  Vascular: Vessel Right Left  Radial Palpable Palpable  Brachial Palpable Palpable  Carotid Palpable, without bruit Palpable, without bruit  Aorta Not palpable N/A  Femoral Palpable Palpable  Popliteal  Palpable Palpable  PT Not Palpable Not Palpable  DP Not Palpable  Not Palpable   Gastrointestinal: soft, NTND, -G/R, - HSM, - masses, - CVAT B  Musculoskeletal: M/S 5/5 throughout , Extremities without ischemic changes except  R 3rd and 4th toes with macerated skin overlying interdigit space with some dry gangrene underlying skin, cyanotic R 3rd and 4th toes  Neurologic: CN 2-12 grossly intact , Pain and light touch intact in extremities , Motor exam as listed above  Psychiatric: Judgment intact, Mood & affect appropriate for pt's clinical situation  Dermatologic: See M/S exam for extremity exam, no rashes otherwise noted  Lymph : No Cervical, Axillary, or Inguinal lymphadenopathy   Laboratory: CBC:    Component Value Date/Time   WBC 7.2 04/02/2013 1250   RBC 3.51* 04/02/2013 1250   HGB 10.6* 04/02/2013 1250   HCT 32.6* 04/02/2013 1250   PLT 178 04/02/2013 1250   MCV 92.9 04/02/2013 1250   MCH 30.2 04/02/2013 1250   MCHC 32.5 04/02/2013 1250   RDW 13.4 04/02/2013 1250   LYMPHSABS 1.1 04/02/2013 1250   MONOABS 0.6 04/02/2013 1250   EOSABS 0.2 04/02/2013 1250   BASOSABS 0.0 04/02/2013 1250    BMP:    Component Value Date/Time   NA 137 04/03/2013 0544   K 4.3 04/03/2013 0544   CL 102 04/03/2013 0544   CO2 25 04/03/2013 0544   GLUCOSE 94 04/03/2013 0544   BUN 14 04/03/2013 0544   CREATININE 1.05 04/03/2013 0544   CALCIUM 9.7 04/03/2013 0544   GFRNONAA 48* 04/03/2013 0544   GFRAA 56* 04/03/2013 0544    Coagulation: Lab Results  Component Value Date   INR 1.0 12/16/2008   No results found for this basename: PTT   Radiology: Dg Foot Complete Right  04/02/2013   CLINICAL DATA:  Foot injury. Non healing wound between 3rd and 4th toes  EXAM: RIGHT FOOT COMPLETE - 3+ VIEW  COMPARISON:  None.  FINDINGS: Normal alignment. Negative for fracture. No evidence of osteomyelitis.  IMPRESSION: No acute abnormality.   Electronically Signed   By: Lindsay Bentley M.D.   On: 04/02/2013 12:50     Non-Invasive Vascular Imaging  ABI (Date: 04/03/2013)  Ordered by myself.  Will be read once available.  Medical Decision Making  Lindsay Bentley is a 79 y.o. female who presents with: R foot ischemia in form of dry gangrene  The patient probably had some decaying soft tissue that is converting to dry gangrene.   I don't see any evidence of ascending cellulitis at this point, which would require a guillotine amputation. I agree with IV Abx started by IM. Would continue with dry to dry dressings to the interspace to let the wound dry out.  I suspect the ischemic tissue is down  to the level of the bone so, realistically toe amputation or higher is the next intervention. BLE ABI with toe pressures will help determine if this will heal. In this patient who still ambulates, it probably is reasonable to try to aggressively salvage the right leg with an angiographic intervention if the ABI suggests one is necessary. It's not clear to me if she is a reasonable surgical candidate for a major reconstruction, given her age.  Further preoperative risk stratification would be needed if that is being contemplated.  I discussed in depth with the patient the nature of atherosclerosis, and emphasized the importance of maximal medical management including strict control of blood pressure, blood glucose, and lipid levels, antiplatelet agents, obtaining regular exercise, and cessation of smoking.  The patient is aware that without maximal medical management the underlying atherosclerotic disease process will progress, limiting the benefit of any interventions. Would draw a lipid panel to get an idea if a statin is appropriate.   The patient is currently on an anti-platelet: ASA.  Thank you for allowing [place] to participate in this patient's care.  Leonides Sake, MD [facility] [place] Office: [phone] Pager: [phone]  04/03/2013, 9:39 AM

## 2013-04-03 NOTE — Progress Notes (Signed)
No orders written call placed to MD at 1945. Will continue to monitor.

## 2013-04-03 NOTE — Evaluation (Signed)
Physical Therapy Evaluation Patient Details Name: Lindsay Bentley MRN: [id number] DOB: Sep 27, 1927 Today's Date: 04/03/2013 Time: 4696-2952 PT Time Calculation (min): 19 min  PT Assessment / Plan / Recommendation History of Present Illness  Patient is an 79 yo female admitted with wounds on Lt 3rd and 4th toes - gangrene.  Awaiting further testing to determine surgical need.  Clinical Impression  Patient presents with problems listed below.  Will benefit from acute PT to maximize independence prior to discharge.  Patient close to baseline functional level and could potentially go home with HHPT follow-up.  However, if patient has surgery to RLE, will need to re-eval for possible SNF placement depending on functional mobility changes.    PT Assessment  Patient needs continued PT services    Follow Up Recommendations  Home health PT;SNF;Supervision - Intermittent    Does the patient have the potential to tolerate intense rehabilitation      Barriers to Discharge Decreased caregiver support Patient lives with granddaughter who works.  Patient is home alone during day.    Equipment Recommendations  None recommended by PT    Recommendations for Other Services     Frequency Min 3X/week    Precautions / Restrictions Precautions Precautions: Fall Restrictions Weight Bearing Restrictions: No   Pertinent Vitals/Pain       Mobility  Bed Mobility Bed Mobility: Supine to Sit;Sitting - Scoot to Edge of Bed Supine to Sit: 5: Supervision;With rails;HOB elevated Sitting - Scoot to Edge of Bed: 5: Supervision;With rail Details for Bed Mobility Assistance: No cues needed.  Assist for safety only. Transfers Transfers: Sit to Stand;Stand to Sit Sit to Stand: 4: Min guard;With upper extremity assist;From bed;From toilet Stand to Sit: 4: Min guard;With upper extremity assist;To toilet;With armrests;To chair/3-in-1 Details for Transfer Assistance: Verbal cues for hand placement with use of RW.   Assist for safety/balance. Ambulation/Gait Ambulation/Gait Assistance: 4: Min guard Ambulation Distance (Feet): 36 Feet Assistive device: Rolling walker Ambulation/Gait Assistance Details: Verbal cues for safe use of RW, gait sequence, and to stand upright during gait.  Balance good with RW. Gait Pattern: Step-through pattern;Decreased step length - right;Decreased step length - left;Trunk flexed Gait velocity: Slow gait speed    Exercises     PT Diagnosis: Difficulty walking;Generalized weakness;Acute pain  PT Problem List: Decreased strength;Decreased activity tolerance;Decreased mobility;Decreased balance;Decreased knowledge of use of DME;Cardiopulmonary status limiting activity;Pain PT Treatment Interventions: DME instruction;Gait training;Functional mobility training;Balance training;Patient/family education     PT Goals(Current goals can be found in the care plan section) Acute Rehab PT Goals Patient Stated Goal: To be able to return home PT Goal Formulation: With patient/family Time For Goal Achievement: 04/10/13 Potential to Achieve Goals: Good  Visit Information  Last PT Received On: 04/03/13 Assistance Needed: +1 History of Present Illness: Patient is an 79 yo female admitted with wounds on Lt 3rd and 4th toes - gangrene.  Awaiting further testing to determine surgical need.       Prior Functioning  Home Living Family/patient expects to be discharged to:: Private residence (Vs SNF) Living Arrangements: Other relatives Available Help at Discharge: Family;Available PRN/intermittently (Granddaughter works) Type of Home: House Home Access: Ramped entrance Home Layout: One level Home Equipment: Environmental consultant - 2 wheels;Bedside commode;Cane - single point Prior Function Level of Independence: Independent with assistive device(s);Needs assistance Gait / Transfers Assistance Needed: Uses cane.  Only ambulates without assist in house.  If goes outside, has min assist of someone  holding her arm for balance/safety. ADL's / Homemaking Assistance Needed:  Assist with meal prep and housekeeping.  Patient takes sponge baths independently. Communication Communication: No difficulties    Cognition  Cognition Arousal/Alertness: Awake/alert Behavior During Therapy: WFL for tasks assessed/performed Overall Cognitive Status: Within Functional Limits for tasks assessed    Extremity/Trunk Assessment Upper Extremity Assessment Upper Extremity Assessment: Overall WFL for tasks assessed Lower Extremity Assessment Lower Extremity Assessment: Generalized weakness;RLE deficits/detail RLE Deficits / Details: Wounds on Rt foot - bandaged. RLE: Unable to fully assess due to pain   Balance Balance Balance Assessed: Yes Static Sitting Balance Static Sitting - Balance Support: No upper extremity supported;Feet supported Static Sitting - Level of Assistance: 5: Stand by assistance Static Sitting - Comment/# of Minutes: 6 minutes.  Lightheadedness initially with sitting.  Resolved with time upright. Static Standing Balance Static Standing - Balance Support: Bilateral upper extremity supported Static Standing - Level of Assistance: 5: Stand by assistance Static Standing - Comment/# of Minutes: 2  End of Session PT - End of Session Equipment Utilized During Treatment: Gait belt Activity Tolerance: Patient limited by pain;Patient limited by fatigue Patient left: in chair;with call bell/phone within reach;with family/visitor present Nurse Communication: Mobility status  GP     Vena Austria 04/03/2013, 12:05 PM Durenda Hurt. Renaldo Fiddler, [facility] Acute Rehab Services Pager [phone]

## 2013-04-04 LAB — URINE CULTURE
Colony Count: NO GROWTH
Culture: NO GROWTH

## 2013-04-04 LAB — LIPID PANEL
HDL: 60 mg/dL (ref >39)
LDL Cholesterol: 79 mg/dL (ref 0–99)
VLDL: 12 mg/dL (ref 0–40)

## 2013-04-04 MED ORDER — SORBITOL 70 % SOLN
50.0000 mL | Freq: Two times a day (BID) | Status: DC
  Administered 2013-04-05 – 2013-04-08 (×3): 50 mL via ORAL
  Filled 2013-04-04 (×9): qty 60

## 2013-04-04 NOTE — Progress Notes (Addendum)
Pt's daughter at the bedside and is requesting a nicotine patch for her mom. She states Lindsay Bentley has been craving a cigarette, she usually smokes a half a back a day for 50+ years. Dr. Mariea Clonts, orders for nicotine patch entered into [facility] by provider. Julien Nordmann [facility]

## 2013-04-04 NOTE — Progress Notes (Signed)
Subjective: Patient was seen at the bedside. She is not having any complaints this morning except mild pain in her foot. She denies chest pain, fevers, SOB, nausea, vomiting. She still has not moved her bowels. I spoke with her daughter on the phone and updated her as to the plan.  Objective: Vital signs in last 24 hours: Filed Vitals:   04/03/13 0936 04/03/13 1448 04/03/13 2135 04/04/13 0501  BP: 122/67 125/55 124/66 138/72  Pulse: 64 65 62 61  Temp:  98.6 F (37 C) 98.5 F (36.9 C) 98.9 F (37.2 C)  TempSrc:  Oral Oral Oral  Resp:  16 16 16   Height:      Weight:      SpO2:  99% 94% 97%   Weight change:   Intake/Output Summary (Last 24 hours) at 04/04/13 0754 Last data filed at 04/03/13 2133  Gross per 24 hour  Intake    453 ml  Output      0 ml  Net    453 ml   General: resting in bed, comfortable HEENT: PERRL, EOMI, no scleral icterus Cardiac: S1 S2 normal Pulm: clear to auscultation bilaterally, moving normal volumes of air Abd: soft, nontender, nondistended, BS present Ext: Wound wrapped and boot in place; exam deferred Neuro: alert and oriented X3, cranial nerves II-XII grossly intact  Lab Results: Basic Metabolic Panel:  Recent Labs Lab 04/02/13 1221 04/03/13 0544  NA 140 137  K 4.4 4.3  CL 103 102  CO2  --  25  GLUCOSE 220* 94  BUN 19 14  CREATININE 1.10 1.05  CALCIUM  --  9.7   Liver Function Tests:  Recent Labs Lab 04/03/13 0544  AST 16  ALT 8  ALKPHOS 63  BILITOT 0.4  PROT 6.7  ALBUMIN 3.3*   CBC:  Recent Labs Lab 04/02/13 1221 04/02/13 1250  WBC  --  7.2  NEUTROABS  --  5.3  HGB 11.6* 10.6*  HCT 34.0* 32.6*  MCV  --  92.9  PLT  --  178   Urinalysis:  Recent Labs Lab 04/02/13 1407  COLORURINE YELLOW  LABSPEC 1.012  PHURINE 6.5  GLUCOSEU NEGATIVE  HGBUR NEGATIVE  BILIRUBINUR NEGATIVE  KETONESUR NEGATIVE  PROTEINUR 100*  UROBILINOGEN 0.2  NITRITE NEGATIVE  LEUKOCYTESUR MODERATE*   Studies/Results: Dg Foot  Complete Right  04/02/2013   CLINICAL DATA:  Foot injury. Non healing wound between 3rd and 4th toes  EXAM: RIGHT FOOT COMPLETE - 3+ VIEW  COMPARISON:  None.  FINDINGS: Normal alignment. Negative for fracture. No evidence of osteomyelitis.  IMPRESSION: No acute abnormality.   Electronically Signed   By: Marlan Palau M.D.   On: 04/02/2013 12:50   Medications: I have reviewed the patient's current medications. Scheduled Meds: . aspirin EC  81 mg Oral Daily  . atenolol  25 mg Oral Daily  . enoxaparin (LOVENOX) injection  40 mg Subcutaneous Q24H  . nicotine  14 mg Transdermal Daily  . senna-docusate  1 tablet Oral BID  . sodium chloride  3 mL Intravenous Q12H  . vancomycin  1,000 mg Intravenous Q24H   Continuous Infusions:  PRN Meds:.HYDROcodone-acetaminophen, nitroGLYCERIN, polyethylene glycol Assessment/Plan:  Necrotic wound - Per vascular surgery likely converting to dry gangrene. She likely has PVD and still awaiting ABI. They are contemplating cosmetic sparing procedure however they would like to weight risks and benefits and think about it over the weekend. She will likely need a surgical intervention and agree that the problem likely affects  the bone. - Continue Vancomycin IV - Vascular surgery considering options - Await ABI - Continue to use doppler to auscultate the pulses and watch for any changes acutely  Diabetes - A1C 6.3. - CBG monitoring every morning - Will need primary care follow up  Constipation - Last BM Tuesday per patient. She is still moving gas and having good bowel sounds.  - Senokot-S, miralax prn, sorbitol BID.   Tobacco abuse - Patient declines nicotine patch at this time. Advised her that smoking can hurt the chances of wounds healing and it is in her best interest to quit at this time. She is contemplating this.   DVT ppx - lovenox subq  Dispo: Disposition is deferred at this time, awaiting improvement of current medical problems.  Anticipated discharge  in approximately 2-3 day(s).   The patient does have a current PCP Abigail Miyamoto, MD) and does not need an [facility] hospital follow-up appointment after discharge.  The patient does not have transportation limitations that hinder transportation to clinic appointments.  .Services Needed at time of discharge: Y = Yes, Blank = No PT:   OT:   RN:   Equipment:   Other:     LOS: 2 days   Vivi Barrack, MD 04/04/2013, 7:54 AM

## 2013-04-05 DIAGNOSIS — K5900 Constipation, unspecified: Secondary | ICD-10-CM

## 2013-04-05 DIAGNOSIS — E119 Type 2 diabetes mellitus without complications: Secondary | ICD-10-CM

## 2013-04-05 DIAGNOSIS — I739 Peripheral vascular disease, unspecified: Secondary | ICD-10-CM

## 2013-04-05 LAB — GLUCOSE, CAPILLARY: Glucose-Capillary: 111 mg/dL — ABNORMAL HIGH (ref 70–99)

## 2013-04-05 MED ORDER — SENNOSIDES-DOCUSATE SODIUM 8.6-50 MG PO TABS
2.0000 | ORAL_TABLET | Freq: Two times a day (BID) | ORAL | Status: DC
  Administered 2013-04-05 – 2013-04-08 (×5): 2 via ORAL
  Filled 2013-04-05 (×8): qty 2

## 2013-04-05 NOTE — Evaluation (Signed)
Occupational Therapy Evaluation Patient Details Name: Samara Stankowski MRN: [id number] DOB: [birth year] Today's Date: 04/05/2013 Time: 4098-1191 OT Time Calculation (min): 26 min  OT Assessment / Plan / Recommendation History of present illness Patient is an 79 yo female admitted with wounds on Lt 3rd and 4th toes - gangrene.  Awaiting further testing to determine surgical need.   Clinical Impression   Pt presents with mild balance issues, but is very near her baseline in ADL and mobility.  Her family assists her with sponge bathing--with her back, and will begin assisting with LE care to inspect skin.  Pt does not have interest in showering even with DME.  Needs to be at a modified independent level in toileting and mobility to return home as she only has intermittent help.  If pt undergoes foot surgery, she will likely need SNF for ST rehab.  Will follow.  OT Assessment  Patient needs continued OT Services    Follow Up Recommendations  Home health OT (may need SNF if she proceeds with foot surgery)    Barriers to Discharge Decreased caregiver support    Equipment Recommendations  None recommended by OT    Recommendations for Other Services    Frequency  Min 2X/week    Precautions / Restrictions Precautions Precautions: Fall Restrictions Weight Bearing Restrictions: No   Pertinent Vitals/Pain L hip pain, chronic, see flow sheet, RN medicated, repositioned    ADL  Eating/Feeding: Independent Where Assessed - Eating/Feeding:  (EOB) Grooming: Wash/dry hands;Min guard Where Assessed - Grooming: Unsupported standing Upper Body Bathing: Minimal assistance (for back) Where Assessed - Upper Body Bathing: Unsupported sitting Lower Body Bathing: Maximal assistance Where Assessed - Lower Body Bathing: Unsupported sitting;Supported sit to stand Upper Body Dressing: Set up Where Assessed - Upper Body Dressing: Unsupported sitting Lower Body Dressing: Maximal assistance Where Assessed -  Lower Body Dressing: Unsupported sitting;Supported sit to stand Toilet Transfer: Min Pension scheme manager Method: Sit to stand Toileting - Architect and Hygiene: Supervision/safety Where Assessed - Engineer, mining and Hygiene: Sit on 3-in-1 or toilet Equipment Used: Rolling walker;Gait belt Transfers/Ambulation Related to ADLs: min guard with RW ADL Comments: Pt is assisted for washing her back at home, family will be assisting with taking care of her feet from this date to avoid wounds going undetected.    OT Diagnosis: Acute pain;Generalized weakness  OT Problem List: Impaired balance (sitting and/or standing);Obesity;Pain;Decreased knowledge of use of DME or AE OT Treatment Interventions: Self-care/ADL training;DME and/or AE instruction;Patient/family education   OT Goals(Current goals can be found in the care plan section) Acute Rehab OT Goals Patient Stated Goal: To be able to return home OT Goal Formulation: With patient Time For Goal Achievement: 04/19/13 Potential to Achieve Goals: Good ADL Goals Pt Will Perform Grooming: with modified independence;standing Pt Will Transfer to Toilet: with modified independence;ambulating;bedside commode Pt Will Perform Toileting - Clothing Manipulation and hygiene: with modified independence;sit to/from stand  Visit Information  Last OT Received On: 04/05/13 Assistance Needed: +1 Reason Eval/Treat Not Completed: Patient at procedure or test/ unavailable (ABI) History of Present Illness: Patient is an 79 yo female admitted with wounds on Lt 3rd and 4th toes - gangrene.  Awaiting further testing to determine surgical need.       Prior Functioning     Home Living Family/patient expects to be discharged to:: Private residence Living Arrangements: Other relatives Available Help at Discharge: Family;Available PRN/intermittently Type of Home: House Home Access: Ramped entrance Home Layout: One level Home  Equipment: Dan Humphreys - 2 wheels;Bedside commode;Cane - single point Prior Function Level of Independence: Independent with assistive device(s);Needs assistance Gait / Transfers Assistance Needed: Uses cane.  Only ambulates without assist in house.  If goes outside, has min assist of someone holding her arm for balance/safety. ADL's / Homemaking Assistance Needed: Assist with meal prep and housekeeping.  Patient takes sponge baths independently. Communication Communication: No difficulties Dominant Hand: Right         Vision/Perception Vision - History Patient Visual Report: No change from baseline   Cognition  Cognition Arousal/Alertness: Awake/alert Behavior During Therapy: WFL for tasks assessed/performed Overall Cognitive Status: Within Functional Limits for tasks assessed    Extremity/Trunk Assessment Upper Extremity Assessment Upper Extremity Assessment: Overall WFL for tasks assessed Lower Extremity Assessment Lower Extremity Assessment: Defer to PT evaluation RLE Deficits / Details: Wounds on Rt foot - bandaged.     Mobility Bed Mobility Bed Mobility: Supine to Sit;Sitting - Scoot to Edge of Bed Supine to Sit: 5: Supervision;With rails;HOB elevated Sitting - Scoot to Edge of Bed: 5: Supervision;With rail Transfers Transfers: Sit to Stand;Stand to Sit Sit to Stand: 4: Min guard;With upper extremity assist;From bed;From toilet Stand to Sit: 4: Min guard;With upper extremity assist;To toilet;With armrests;To chair/3-in-1 Details for Transfer Assistance: Verbal cues for hand placement with use of RW.  Assist for safety/balance.     Exercise     Balance     End of Session OT - End of Session Activity Tolerance: Patient tolerated treatment well Patient left: in bed;with call bell/phone within reach;with nursing/sitter in room;with family/visitor present  GO     Evern Bio 04/05/2013, 12:11 PM [phone]

## 2013-04-05 NOTE — Progress Notes (Signed)
  Date: 04/05/2013  Patient name: Lindsay Bentley  Medical record number: [id number]  Date of birth: [birth year]   This patient has been seen and the plan of care was discussed with the house staff. Please see their note for complete details. I concur with their findings with the following additions/corrections:  ABI's completed and reviewed in chart.  Will await further vascular surgery recommendations based on ABI findings.    Inez Catalina, MD 04/05/2013, 12:05 PM

## 2013-04-05 NOTE — Care Management Note (Unsigned)
    Page 1 of 1   04/05/2013     2:26:20 PM   CARE MANAGEMENT NOTE 04/05/2013  Patient:  Lindsay Bentley, Lindsay Bentley   Account Number:  [id number]  Date Initiated:  04/05/2013  Documentation initiated by:  Letha Cape  Subjective/Objective Assessment:   dx wet gangrene  admit- lives with grand daughter.     Action/Plan:   pt/ot eval- recs hhpt/ot  vs snf, depending on if patient decides to have surgery.   Anticipated DC Date:  04/07/2013   Anticipated DC Plan:  HOME W HOME HEALTH SERVICES      DC Planning Services  CM consult      Choice offered to / List presented to:             Status of service:  In process, will continue to follow Medicare Important Message given?   (If response is "NO", the following Medicare IM given date fields will be blank) Date Medicare IM given:   Date Additional Medicare IM given:    Discharge Disposition:    Per UR Regulation:  Reviewed for med. necessity/level of care/duration of stay  If discussed at Long Length of Stay Meetings, dates discussed:    Comments:  04/05/13 14:23 Letha Cape RN, BSN [phone] patient lives with grand daughter, per pt recs hhpt/ot vs snf depending on if patient decides to have surgery. NCM will continue to follow for dc needs.

## 2013-04-05 NOTE — Progress Notes (Signed)
OT Cancellation Note  Patient Details Name: Lindsay Bentley MRN: [id number] DOB: 04/01/1928   Cancelled Treatment:    Reason Eval/Treat Not Completed: Patient at procedure or test/ unavailable (ABI) Will continue to follow.  Evern Bio 04/05/2013, 9:05 AM

## 2013-04-05 NOTE — Progress Notes (Signed)
VASCULAR LAB PRELIMINARY  ARTERIAL  ABI completed:    RIGHT    LEFT    PRESSURE WAVEFORM  PRESSURE WAVEFORM  BRACHIAL 139 Triphasic  BRACHIAL 131 triphasic  DP   DP    AT 80 monophasic AT 118 Dampened monophasic  PT 86 Dampened monophasic PT 83 monophasic  PER   PER    GREAT TOE 22 adequate GREAT TOE 80 adequate    RIGHT LEFT  ABI 0.62 0.85                         TBI                                           0.16                                           0.36  PPG waveforms were absent for the right third, fourth, and fifth toes.  The bilateral first and right second toes show adequate PPG waveforms.  Lindsay Bentley, RVT 04/05/2013, 8:49 AM

## 2013-04-05 NOTE — Consult Note (Signed)
WOC consult Note Reason for Consult:  Ischemic wounds to right 3rd and 4th toes.  Lanced abscess to upper left back.  Wound type: Ischemic toes and lanced abscess Measurement: Toes are intact and black.  Dry dressing in place. To be taken down for ABI this AM. Vascular team aware.  Abscess to left upper back is 0.5 cm x 3 cm x 1 cm with circumferential undermining from 1 o'clock to 8 o'clock. Wound bed: Abscess has 50% gray devitalized tissue and 50% pink wound bed.  Moderate amount devitalized tissue removed with cotton tip applicator.   Drainage (amount, consistency, odor) Ischemic toes have foul odor, no drainage.  Left upper back has minimal purulent drainage.  Periwound: Intact to upper back.  3rd and 4th toes are necrotic.  Dressing procedure/placement/frequency: Back wound cleansed with normal saline and gray, devitalized tissue removed from wound bed.  Wound bed, including underminining packed with Hydrogel moistened plain packing strip. Placed silicone border foam over top due to patient states wound is painful and foam alleviates pain while lying in bed.  Packing strip should be changed daily and Allevyn foam every 3 days and PRN soilage. Will not follow at this time.  Please re-consult if needed.  Maple Hudson RN BSN CWON Pager [phone]

## 2013-04-05 NOTE — Progress Notes (Signed)
PT Cancellation Note  Patient Details Name: Lindsay Bentley MRN: [id number] DOB: November 01, 1927   Cancelled Treatment:    Reason Eval/Treat Not Completed: Pain limiting ability to participate;Fatigue/lethargy limiting ability to participate.  Patient reports she is just "settling down".  Her pain has been high.  Patient declined PT today.  Awaiting decision on possible surgery.  Will return tomorrow for PT session.   Vena Austria 04/05/2013, 2:40 PM Durenda Hurt. Renaldo Fiddler, [facility] Acute Rehab Services Pager [phone]

## 2013-04-05 NOTE — Progress Notes (Signed)
Subjective: Patient was seen at the bedside. She is not having any complaints this morning except mild pain in her foot. She denies chest pain, fevers, SOB, nausea, vomiting. She still has not moved her bowels. I spoke with her daughter on the phone and updated her as to the plan.  Objective: Vital signs in last 24 hours: Filed Vitals:   04/04/13 0501 04/04/13 1402 04/04/13 2120 04/05/13 0548  BP: 138/72 125/71 114/67 125/72  Pulse: 61 67 62 65  Temp: 98.9 F (37.2 C) 98.6 F (37 C) 99.6 F (37.6 C) 99.4 F (37.4 C)  TempSrc: Oral Oral Oral Oral  Resp: 16 16 16 17   Height:      Weight:      SpO2: 97% 97% 92% 99%   Weight change:   Intake/Output Summary (Last 24 hours) at 04/05/13 0850 Last data filed at 04/04/13 1404  Gross per 24 hour  Intake    680 ml  Output      0 ml  Net    680 ml   General: resting in bed, comfortable HEENT: PERRL, EOMI, no scleral icterus Cardiac: S1 S2 normal Pulm: clear to auscultation bilaterally, moving normal volumes of air Abd: soft, nontender, nondistended, BS present Ext: Wound wrapped and boot in place; exam deferred Neuro: alert and oriented X3, cranial nerves II-XII grossly intact  Lab Results: Basic Metabolic Panel:  Recent Labs Lab 04/02/13 1221 04/03/13 0544  NA 140 137  K 4.4 4.3  CL 103 102  CO2  --  25  GLUCOSE 220* 94  BUN 19 14  CREATININE 1.10 1.05  CALCIUM  --  9.7   Liver Function Tests:  Recent Labs Lab 04/03/13 0544  AST 16  ALT 8  ALKPHOS 63  BILITOT 0.4  PROT 6.7  ALBUMIN 3.3*   CBC:  Recent Labs Lab 04/02/13 1221 04/02/13 1250  WBC  --  7.2  NEUTROABS  --  5.3  HGB 11.6* 10.6*  HCT 34.0* 32.6*  MCV  --  92.9  PLT  --  178   Urinalysis:  Recent Labs Lab 04/02/13 1407  COLORURINE YELLOW  LABSPEC 1.012  PHURINE 6.5  GLUCOSEU NEGATIVE  HGBUR NEGATIVE  BILIRUBINUR NEGATIVE  KETONESUR NEGATIVE  PROTEINUR 100*  UROBILINOGEN 0.2  NITRITE NEGATIVE  LEUKOCYTESUR MODERATE*    Studies/Results: No results found. Medications: I have reviewed the patient's current medications. Scheduled Meds: . aspirin EC  81 mg Oral Daily  . atenolol  25 mg Oral Daily  . enoxaparin (LOVENOX) injection  40 mg Subcutaneous Q24H  . nicotine  14 mg Transdermal Daily  . senna-docusate  1 tablet Oral BID  . sodium chloride  3 mL Intravenous Q12H  . sorbitol  50 mL Oral BID  . vancomycin  1,000 mg Intravenous Q24H   Continuous Infusions:  PRN Meds:.HYDROcodone-acetaminophen, nitroGLYCERIN, polyethylene glycol Assessment/Plan:  Necrotic wound - Per vascular surgery likely converting to dry gangrene. ABIs performed, showed PVD (0.62 right, 0.85 left). They will decide surgery approach today based on results. - Continue Vancomycin IV - Will await further vascular surgery recommendations based on ABI findings - Will continue to use doppler to auscultate the pulses and watch for any changes acutely  Diabetes - A1C 6.3. - CBG monitoring every morning - Will need primary care follow up  Constipation - Last BM Tuesday per patient. She is still moving gas and having good bowel sounds.  - Senokot-S 2 tabs BID, miralax prn, sorbitol BID - Continue to monitor  Tobacco abuse - Patient declines nicotine patch at this time. Advised her that smoking can hurt the chances of wounds healing and it is in her best interest to quit at this time. She is contemplating this.   DVT ppx - lovenox subq  Dispo: Disposition is deferred at this time, awaiting improvement of current medical problems.  Anticipated discharge in approximately 2-3 day(s).   The patient does have a current PCP Abigail Miyamoto, MD) and does not need an [facility] hospital follow-up appointment after discharge.  The patient does not have transportation limitations that hinder transportation to clinic appointments.  .Services Needed at time of discharge: Y = Yes, Blank = No PT:   OT:   RN:   Equipment:   Other:     LOS:  3 days   Vivi Barrack, MD 04/05/2013, 8:50 AM

## 2013-04-05 NOTE — Progress Notes (Signed)
Pt began putting out loose stools. Cdiff test ordered per protocol, enteric precautions.

## 2013-04-06 LAB — GLUCOSE, CAPILLARY

## 2013-04-06 LAB — CLOSTRIDIUM DIFFICILE BY PCR: Toxigenic C. Difficile by PCR: NEGATIVE

## 2013-04-06 MED ORDER — SODIUM CHLORIDE 0.9 % IV SOLN
INTRAVENOUS | Status: DC
  Administered 2013-04-06: 13:00:00 via INTRAVENOUS

## 2013-04-06 NOTE — Progress Notes (Signed)
[facility]  Daily Progress Note  Assessment/Planning: R foot dry gangrene   ABI reviewed  Based on the ABI, I doubt the patient can heal any toe amputation.  Pt will need aortogram, bilateral leg runoff, and possible right leg runoff to determine her revascularization option and possible salvage the right foot. I discussed with the patient the nature of angiographic procedures, especially the limited patencies of any endovascular intervention.  The patient is aware of that the risks of an angiographic procedure include but are not limited to: bleeding, infection, access site complications, renal failure, embolization, rupture of vessel, dissection, possible need for emergent surgical intervention, possible need for surgical procedures to treat the patient's pathology, anaphylactic reaction to contrast, and stroke and death.  The patient is aware of the risks and agrees to proceed.  I will arrange for the angiogram tomorrow.  The patient is pondering whether she would prefer a palliative approach to her right foot gangrene.  Subjective   Minimal pain  Objective Filed Vitals:   04/05/13 1420 04/05/13 2102 04/06/13 0558 04/06/13 0815  BP: 106/70 119/69 124/74 117/69  Pulse: 76 68 80 68  Temp: 98.6 F (37 C) 98.8 F (37.1 C) 99 F (37.2 C) 98.7 F (37.1 C)  TempSrc: Oral Oral Oral Oral  Resp: 20 18 18 17   Height:      Weight:      SpO2: 94% 97% 100% 97%    Intake/Output Summary (Last 24 hours) at 04/06/13 1203 Last data filed at 04/05/13 1421  Gross per 24 hour  Intake    480 ml  Output      0 ml  Net    480 ml    PULM  CTAB CV  RRR GI  soft, NTND VASC  R 3rd and 4th ischemic toes with dry gangrene, no frank drainage  Laboratory CBC    Component Value Date/Time   WBC 7.2 04/02/2013 1250   HGB 10.6* 04/02/2013 1250   HCT 32.6* 04/02/2013 1250   PLT 178 04/02/2013 1250    BMET    Component Value Date/Time   NA 137 04/03/2013 0544    K 4.3 04/03/2013 0544   CL 102 04/03/2013 0544   CO2 25 04/03/2013 0544   GLUCOSE 94 04/03/2013 0544   BUN 14 04/03/2013 0544   CREATININE 1.05 04/03/2013 0544   CALCIUM 9.7 04/03/2013 0544   GFRNONAA 48* 04/03/2013 0544   GFRAA 56* 04/03/2013 0544    Leonides Sake, MD [facility] Office: [phone] Pager: [phone]  04/06/2013, 12:03 PM

## 2013-04-06 NOTE — Clinical Social Work Psychosocial (Signed)
Clinical Social Work Department BRIEF PSYCHOSOCIAL ASSESSMENT 04/06/2013  Patient:  KARAN, INCLAN     Account Number:  [id number]     Admit date:  04/02/2013  Clinical Social Worker:  Lavell Luster  Date/Time:  04/06/2013 03:08 PM  Referred by:  Physician  Date Referred:  04/06/2013 Referred for  SNF Placement   Other Referral:   Interview type:  Patient Other interview type:    PSYCHOSOCIAL DATA Living Status:  OTHER RELATIVE Admitted from facility:   Level of care:   Primary support name:  Luciano Cutter [phone] Primary support relationship to patient:  CHILD, ADULT Degree of support available:   Support is strong. Patient lives with grandaughter and can rely on daughter.    CURRENT CONCERNS Current Concerns  Post-Acute Placement   Other Concerns:    SOCIAL WORK ASSESSMENT / PLAN CSW met with patient to discuss recommendation for SNF. Patient is agreeable to SNF search and would like to find a facility in [facility]. Patient has no previous SNF experience. Patient from home with grandaughter and two great grandchildren. Patient's main support is daughter Luciano Cutter [phone]. Patient alert and oriented at time of assessment.   Assessment/plan status:  Psychosocial Support/Ongoing Assessment of Needs Other assessment/ plan:   Complete FL2, PASRR, Fax Out   Information/referral to community resources:   SNF list and CSW contact information left with patient.    PATIENT'S/FAMILY'S RESPONSE TO PLAN OF CARE: Patient is agreeable to SNF search in [facility]. Patient is verhy pleasant and was appreciative of CSW visit. Patient awaits bed offers. CSW will continue to follow.     Roddie Mc, [place], [place], [phone]

## 2013-04-06 NOTE — Progress Notes (Signed)
  Date: 04/06/2013  Patient name: Lindsay Bentley  Medical record number: [id number]  Date of birth: 02-Sep-1927   This patient has been seen and the plan of care was discussed with the house staff, including Dr. Claudell Kyle please see her note for complete details. I concur with their findings with the following additions/corrections:  Have reviewed Dr. Nicky Pugh note as well, plan for angiogram tomorrow for further evaluation of right foot dry gangrene.   Inez Catalina, MD 04/06/2013, 1:29 PM

## 2013-04-06 NOTE — Clinical Social Work Placement (Signed)
Clinical Social Work Department CLINICAL SOCIAL WORK PLACEMENT NOTE 04/06/2013  Patient:  Lindsay Bentley, Lindsay Bentley  Account Number:  [id number] Admit date:  04/02/2013  Clinical Social Worker:  Lavell Luster  Date/time:  04/06/2013 03:46 PM  Clinical Social Work is seeking post-discharge placement for this patient at the following level of care:   SKILLED NURSING   (*CSW will update this form in Epic as items are completed)   04/06/2013  Patient/family provided with [facility] Department of Clinical Social Work's list of facilities offering this level of care within the geographic area requested by the patient (or if unable, by the patient's family).  04/06/2013  Patient/family informed of their freedom to choose among providers that offer the needed level of care, that participate in Medicare, Medicaid or managed care program needed by the patient, have an available bed and are willing to accept the patient.  04/06/2013  Patient/family informed of [facility]' ownership interest in [facility], as well as of the fact that they are under no obligation to receive care at this facility.  PASARR submitted to EDS on 04/06/2013 PASARR number received from EDS on 04/06/2013  FL2 transmitted to all facilities in geographic area requested by pt/family on  04/06/2013 FL2 transmitted to all facilities within larger geographic area on   Patient informed that his/her managed care company has contracts with or will negotiate with  certain facilities, including the following:     Patient/family informed of bed offers received:   Patient chooses bed at  Physician recommends and patient chooses bed at    Patient to be transferred to  on   Patient to be transferred to facility by   The following physician request were entered in Epic:   Additional Comments:   Roddie Mc, [place], [place], [phone]

## 2013-04-06 NOTE — Progress Notes (Signed)
Subjective: Patient was seen at the bedside. She is not having any complaints this morning. She denies chest pain, fevers, SOB, nausea, vomiting. She had a large loose BM yesterday s/p multiple stool softeners. The nurse checked a C Diff and it was negative.   Objective: Vital signs in last 24 hours: Filed Vitals:   04/05/13 1420 04/05/13 2102 04/06/13 0558 04/06/13 0815  BP: 106/70 119/69 124/74 117/69  Pulse: 76 68 80 68  Temp: 98.6 F (37 C) 98.8 F (37.1 C) 99 F (37.2 C) 98.7 F (37.1 C)  TempSrc: Oral Oral Oral Oral  Resp: 20 18 18 17   Height:      Weight:      SpO2: 94% 97% 100% 97%   Weight change:   Intake/Output Summary (Last 24 hours) at 04/06/13 0846 Last data filed at 04/05/13 1421  Gross per 24 hour  Intake   1120 ml  Output      0 ml  Net   1120 ml   General: resting in bed, comfortable HEENT: PERRL, EOMI, no scleral icterus Cardiac: S1 S2 normal Pulm: clear to auscultation bilaterally, moving normal volumes of air Abd: soft, nontender, nondistended, BS present Ext: Wound wrapped and boot in place; exam deferred Neuro: alert and oriented X3, cranial nerves II-XII grossly intact  Lab Results: Basic Metabolic Panel:  Recent Labs Lab 04/02/13 1221 04/03/13 0544  NA 140 137  K 4.4 4.3  CL 103 102  CO2  --  25  GLUCOSE 220* 94  BUN 19 14  CREATININE 1.10 1.05  CALCIUM  --  9.7   Liver Function Tests:  Recent Labs Lab 04/03/13 0544  AST 16  ALT 8  ALKPHOS 63  BILITOT 0.4  PROT 6.7  ALBUMIN 3.3*   CBC:  Recent Labs Lab 04/02/13 1221 04/02/13 1250  WBC  --  7.2  NEUTROABS  --  5.3  HGB 11.6* 10.6*  HCT 34.0* 32.6*  MCV  --  92.9  PLT  --  178   Urinalysis:  Recent Labs Lab 04/02/13 1407  COLORURINE YELLOW  LABSPEC 1.012  PHURINE 6.5  GLUCOSEU NEGATIVE  HGBUR NEGATIVE  BILIRUBINUR NEGATIVE  KETONESUR NEGATIVE  PROTEINUR 100*  UROBILINOGEN 0.2  NITRITE NEGATIVE  LEUKOCYTESUR MODERATE*   Studies/Results: No  results found. Medications: I have reviewed the patient's current medications. Scheduled Meds: . aspirin EC  81 mg Oral Daily  . atenolol  25 mg Oral Daily  . enoxaparin (LOVENOX) injection  40 mg Subcutaneous Q24H  . nicotine  14 mg Transdermal Daily  . senna-docusate  2 tablet Oral BID  . sodium chloride  3 mL Intravenous Q12H  . sorbitol  50 mL Oral BID  . vancomycin  1,000 mg Intravenous Q24H   Continuous Infusions:  PRN Meds:.HYDROcodone-acetaminophen, nitroGLYCERIN, polyethylene glycol Assessment/Plan:  Necrotic wound - Per vascular surgery likely converting to dry gangrene. ABIs and TBIs performed yesterday, showed PVD (0.62 right, 0.85 left). They will decide surgical approach today based on results. - Follow up vascular surgery recommendations (Dr. Imogene Burn to see) - Continue Vancomycin IV - Will continue to use doppler to auscultate the pulses and watch for any changes acutely - Follow up CBC/ BMP tomorrow am  Diabetes - A1C 6.3. - CBG monitoring every morning - Will need primary care follow up as this is a new diagnosis  Constipation - Had a large, loose BM yesterday. C Diff negative. - Continue Senokot-S 2 tabs BID, miralax prn, sorbitol BID - Continue to  monitor  Tobacco abuse - Patient declines nicotine patch at this time. Advised her that smoking can hurt the chances of wounds healing and it is in her best interest to quit at this time. She is contemplating this.   DVT ppx - lovenox subq  Dispo: Disposition is deferred at this time, awaiting improvement of current medical problems.  Anticipated discharge in approximately 2-3 day(s).   The patient does have a current PCP Abigail Miyamoto, MD) and does not need an [facility] hospital follow-up appointment after discharge.  The patient does not have transportation limitations that hinder transportation to clinic appointments.  .Services Needed at time of discharge: Y = Yes, Blank = No PT:   OT:   RN:   Equipment:     Other:     LOS: 4 days   Vivi Barrack, MD 04/06/2013, 8:46 AM

## 2013-04-06 NOTE — Progress Notes (Signed)
Physical Therapy Treatment Patient Details Name: Lindsay Bentley MRN: [id number] DOB: January 14, 1928 Today's Date: 04/06/2013 Time: 4098-1191 PT Time Calculation (min): 25 min  PT Assessment / Plan / Recommendation  History of Present Illness Patient is an 79 yo female admitted with wounds on Lt 3rd and 4th toes - gangrene.  Awaiting further testing to determine surgical need.   PT Comments   Pt progressing towards physical therapy goals. Pt able to use bedside commode as well as stand at the sink without UE assist after for hygiene. Pt appears to fatigue quickly, but was able to increase distance ambulated with good form and minimal cueing for walker placement. Pt reports they are to do surgery on R foot tomorrow morning.  Follow Up Recommendations  SNF     Does the patient have the potential to tolerate intense rehabilitation     Barriers to Discharge        Equipment Recommendations  None recommended by PT    Recommendations for Other Services    Frequency Min 3X/week   Progress towards PT Goals Progress towards PT goals: Progressing toward goals  Plan Current plan remains appropriate    Precautions / Restrictions Precautions Precautions: Fall Restrictions Weight Bearing Restrictions: No   Pertinent Vitals/Pain Pt reports no pain throughout session.    Mobility  Bed Mobility Bed Mobility: Supine to Sit;Sitting - Scoot to Edge of Bed Supine to Sit: 5: Supervision;With rails;HOB elevated Sitting - Scoot to Edge of Bed: 5: Supervision;With rail Details for Bed Mobility Assistance: Pt able to perform with good safety awareness and technique. No physical assist needed. Transfers Transfers: Sit to Stand;Stand to Sit Sit to Stand: 5: Supervision;From bed;4: Min guard;From chair/3-in-1 Stand to Sit: 4: Min guard;To chair/3-in-1 Details for Transfer Assistance: Very minimal cueing needed for hand placement on seated surface prior to initiating  transfers. Ambulation/Gait Ambulation/Gait Assistance: 4: Min guard Ambulation Distance (Feet): 125 Feet Assistive device: Rolling walker Ambulation/Gait Assistance Details: Pt was cued for walker placement close to pt's body for safety.  Gait Pattern: Step-to pattern;Decreased stride length Gait velocity: Decreased Stairs: No    Exercises     PT Diagnosis:    PT Problem List:   PT Treatment Interventions:     PT Goals (current goals can now be found in the care plan section) Acute Rehab PT Goals Patient Stated Goal: To be able to return home PT Goal Formulation: With patient/family Time For Goal Achievement: 04/10/13 Potential to Achieve Goals: Good  Visit Information  Last PT Received On: 04/06/13 Assistance Needed: +1 History of Present Illness: Patient is an 79 yo female admitted with wounds on Lt 3rd and 4th toes - gangrene.  Awaiting further testing to determine surgical need.    Subjective Data  Subjective: "I think I need to use the bathroom while we are up." Patient Stated Goal: To be able to return home   Cognition  Cognition Arousal/Alertness: Awake/alert Behavior During Therapy: WFL for tasks assessed/performed Overall Cognitive Status: Within Functional Limits for tasks assessed    Balance  Balance Balance Assessed: Yes Static Sitting Balance Static Sitting - Level of Assistance: 5: Stand by assistance Static Sitting - Comment/# of Minutes: 5 minutes while donning brief and extra gown Dynamic Standing Balance Dynamic Standing - Balance Support: No upper extremity supported;During functional activity Dynamic Standing - Level of Assistance: 5: Stand by assistance Dynamic Standing - Balance Activities: Lateral lean/weight shifting;Forward lean/weight shifting;Reaching for objects  End of Session PT - End of Session Equipment Utilized  During Treatment: Gait belt Activity Tolerance: Patient limited by fatigue Patient left: in chair;with call bell/phone  within reach   GP     Ruthann Cancer 04/06/2013, 2:36 PM  Ruthann Cancer, PT, DPT Acute Rehabilitation Services

## 2013-04-06 NOTE — Progress Notes (Addendum)
ANTIBIOTIC CONSULT NOTE - Follow up  Pharmacy Consult for Vancomycin Indication: Right foot wounds  No Known Allergies  Patient Measurements: Height: 5\' 3"  (160 cm) Weight: 148 lb 9.4 oz (67.4 kg) IBW/kg (Calculated) : 52.4  Vital Signs: Temp: 98.7 F (37.1 C) (09/30 0815) Temp src: Oral (09/30 0815) BP: 117/69 mmHg (09/30 0815) Pulse Rate: 68 (09/30 0815) Intake/Output from previous day: 09/29 0701 - 09/30 0700 In: 1120 [P.O.:720; IV Piggyback:400] Out: -   Labs: No results found for this basename: WBC, HGB, PLT, LABCREA, CREATININE,  in the last 72 hours Estimated Creatinine Clearance: 38.1 ml/min (by C-G formula based on Cr of 1.05).  Recent Labs  04/06/13 1218  VANCOTROUGH 9.7*     Microbiology: Recent Results (from the past 720 hour(s))  URINE CULTURE     Status: None   Collection Time    04/02/13  2:07 PM      Result Value Range Status   Specimen Description URINE, RANDOM   Final   Special Requests ADDED [id number]   Final   Culture  Setup Time     Final   Value: 04/03/2013 00:44     Performed at [facility]   Colony Count     Final   Value: NO GROWTH     Performed at [facility]   Culture     Final   Value: NO GROWTH     Performed at [facility]   Report Status 04/04/2013 FINAL   Final  CLOSTRIDIUM DIFFICILE BY PCR     Status: None   Collection Time    04/05/13  7:52 PM      Result Value Range Status   C difficile by pcr NEGATIVE  NEGATIVE Final    Medical History: Past Medical History  Diagnosis Date  . Hypertension   . Gangrene of foot     Medications:  Prescriptions prior to admission  Medication Sig Dispense Refill  . aspirin 81 MG tablet Take 81 mg by mouth daily.      Marland Kitchen atenolol (TENORMIN) 25 MG tablet Take 25 mg by mouth daily.      . nitroGLYCERIN (NITROSTAT) 0.4 MG SL tablet Place 0.4 mg under the tongue every 5 (five) minutes as needed for chest pain.      . pravastatin (PRAVACHOL) 10 MG tablet Take 10  mg by mouth daily.       Assessment: Vancomycin trough = 9.7 mcg/ml (drawn ~2 hours late) in this 79 yo F who is currently receiving vancomycin 1 gm IV q24h for right foot wound infection.  This steady state trough was drawn ~2 hours late. Goal trough 10-15 mcg/ml ~30 minutes prior to next dose, thus expect true trough level at 0930 today would have been within 10-15 mcg/ml range. On admission the Xray showed no signs of OM, does not meet SIRS criteria. Afebrile, Tm 99 F, WBC wnl (7.2) on 04/02/13, SCr 1.05 on 04/03/13, CrCl ~38 ml/min.  BMET and CBC ordered for tomorrow AM.  I will current dose vancomycin 1 gm IV q24h.    Goal of Therapy:  Vancomycin trough level 10-15 mcg/ml  Plan:  - Vanc 1g IV q24h - f/u temp, WBC, C&S, levels prn   Noah Delaine, RPh Clinical Pharmacist Pager: [phone] 04/06/2013 2:03 PM

## 2013-04-07 ENCOUNTER — Encounter (HOSPITAL_COMMUNITY)
Admission: EM | Disposition: A | Discharge status: Skilled Nursing Facility | Payer: Self-pay | Source: Home / Self Care | Attending: Internal Medicine

## 2013-04-07 DIAGNOSIS — I70269 Atherosclerosis of native arteries of extremities with gangrene, unspecified extremity: Secondary | ICD-10-CM

## 2013-04-07 HISTORY — PX: ABDOMINAL AORTAGRAM: SHX5454

## 2013-04-07 LAB — CBC
HCT: 28 % — ABNORMAL LOW (ref 36.0–46.0)
Hemoglobin: 9.4 g/dL — ABNORMAL LOW (ref 12.0–15.0)
MCV: 90.3 fL (ref 78.0–100.0)
RBC: 3.1 MIL/uL — ABNORMAL LOW (ref 3.87–5.11)
RDW: 12.9 % (ref 11.5–15.5)
WBC: 6.9 10*3/uL (ref 4.0–10.5)

## 2013-04-07 LAB — GLUCOSE, CAPILLARY
Glucose-Capillary: 96 mg/dL (ref 70–99)
Glucose-Capillary: 80 mg/dL (ref 70–99)

## 2013-04-07 LAB — BASIC METABOLIC PANEL
BUN: 23 mg/dL (ref 6–23)
CO2: 23 mEq/L (ref 19–32)
Chloride: 104 mEq/L (ref 96–112)
Creatinine, Ser: 1.14 mg/dL — ABNORMAL HIGH (ref 0.50–1.10)
GFR calc non Af Amer: 44 mL/min — ABNORMAL LOW (ref >90)
Potassium: 3.8 mEq/L (ref 3.5–5.1)
Sodium: 137 mEq/L (ref 135–145)

## 2013-04-07 LAB — SURGICAL PCR SCREEN: MRSA, PCR: NEGATIVE

## 2013-04-07 SURGERY — ABDOMINAL AORTAGRAM
Anesthesia: LOCAL

## 2013-04-07 MED ORDER — SODIUM CHLORIDE 0.9 % IV SOLN: 1.0000 mL/kg/h | INTRAVENOUS | Status: AC

## 2013-04-07 MED ORDER — HYDRALAZINE HCL 20 MG/ML IJ SOLN: 10.0000 mg | INTRAMUSCULAR | Status: DC | PRN

## 2013-04-07 MED ORDER — ONDANSETRON HCL 4 MG/2ML IJ SOLN: 4.0000 mg | Freq: Four times a day (QID) | INTRAMUSCULAR | Status: DC | PRN

## 2013-04-07 MED ORDER — LIDOCAINE HCL (PF) 1 % IJ SOLN
INTRAMUSCULAR | Status: AC
  Filled 2013-04-07: qty 30

## 2013-04-07 MED ORDER — MIDAZOLAM HCL 2 MG/2ML IJ SOLN
INTRAMUSCULAR | Status: AC
  Filled 2013-04-07: qty 2

## 2013-04-07 MED ORDER — HEPARIN (PORCINE) IN NACL 2-0.9 UNIT/ML-% IJ SOLN
INTRAMUSCULAR | Status: AC
  Filled 2013-04-07: qty 1000

## 2013-04-07 MED ORDER — FENTANYL CITRATE 0.05 MG/ML IJ SOLN
INTRAMUSCULAR | Status: AC
  Filled 2013-04-07: qty 2

## 2013-04-07 MED ORDER — ACETAMINOPHEN 325 MG PO TABS: 650.0000 mg | ORAL_TABLET | ORAL | Status: DC | PRN

## 2013-04-07 MED ORDER — ENOXAPARIN SODIUM 40 MG/0.4ML ~~LOC~~ SOLN
40.0000 mg | SUBCUTANEOUS | Status: DC
  Administered 2013-04-08: 40 mg via SUBCUTANEOUS
  Filled 2013-04-07 (×2): qty 0.4

## 2013-04-07 NOTE — Progress Notes (Signed)
OT Cancellation Note  Patient Details Name: Lindsay Bentley MRN: [id number] DOB: 1927/08/18   Cancelled Treatment:    Reason Eval/Treat Not Completed: Patient at procedure or test/ unavailable  Evette Georges [phone] 04/07/2013, 4:03 PM

## 2013-04-07 NOTE — Progress Notes (Signed)
[facility]  Pt was transferred to 3W per new post-cath protocol to help increase safety after femoral cannulation.  No revascularization option for R foot.  Patient and family considering their options: palliative mgmt of R foot vs R BKA.  Leonides Sake, MD [facility] [place] Office: [phone] Pager: [phone]  04/07/2013, 4:54 PM

## 2013-04-07 NOTE — Op Note (Addendum)
OPERATIVE NOTE   PROCEDURE: 1.  Left common femoral artery cannulation under ultrasound guidance 2.  Placement of catheter in aorta 3.  Aortogram 4.  Bilateral leg runoff  PRE-OPERATIVE DIAGNOSIS: right foot gangrene  POST-OPERATIVE DIAGNOSIS: same as above   SURGEON: Leonides Sake, MD  ANESTHESIA: conscious sedation  ESTIMATED BLOOD LOSS: 50 cc  CONTRAST: 200 cc  FINDING(S):  Aorta: Diffusely diseased throughout, small PAU distally  Superior mesenteric artery: patent Celiac artery: patent   Right Left  RA patent patent  CIA Patent, diffusely diseased Patent, diffusely diseased  EIA Patent, diffusely diseased Patent, diffusely diseased  IIA Patent Patent  CFA Patent Patent  SFA Patent with diffuse calcified disease, 2 50% stenoses distally Patent with diffuse calcified disease, 75% and 50% stenoses distally  PFA Patent Patent  Pop Patent Patent  Trif Patent Patent  AT Occludes shortly after takeoff, reconstitutes distally via collaterals Occludes shortly after takeoff  Pero Diffusely disease with multiple  Patent to foot, only runoff  PT Occludes shortly after takeoff, reconstitutes a miniscule vessel distally via collaterals Occludes shortly after takeoff   SPECIMEN(S):  none  INDICATIONS:   Lindsay Bentley is a 79 y.o. female who presents with right foot gangrene.  The patient presents for: aortogram, bilateral leg runoff, and possible right leg intervention.  I discussed with the patient the nature of angiographic procedures, especially the limited patencies of any endovascular intervention.  The patient is aware of that the risks of an angiographic procedure include but are not limited to: bleeding, infection, access site complications, renal failure, embolization, rupture of vessel, dissection, possible need for emergent surgical intervention, possible need for surgical procedures to treat the patient's pathology, and stroke and death.  The patient is aware of the risks and  agrees to proceed.  DESCRIPTION: After full informed consent was obtained from the patient, the patient was brought back to the angiography suite.  The patient was placed supine upon the angiography table and connected to monitoring equipment.  The patient was then given conscious sedation, the amounts of which are documented in the patient's chart.  The patient was prepped and drape in the standard fashion for an angiographic procedure.  At this point, attention was turned to the left groin.  Under ultrasound guidance, the left common femoral artery was cannulated with a micropuncture needle.  The microwire was advanced into the iliac arterial system.  The needle was exchanged for a microsheath, which was loaded into the common femoral artery over the wire.  The microwire was exchanged for a [facility] wire which was advanced into the aorta.  The microsheath was then exchanged for a 5-Fr sheath which was loaded into the common femoral artery. The Omniflush catheter was then loaded over the wire up to the level of L1.  The catheter was connected to the power injector circuit.  After de-airring and de-clotting the circuit, a power injector aortogram was completed.  I pulled the catheter down to distal aorta and complete magnified pelvic injections to try to visualize the PAU.  I could not clearly identify a discrete contained aortic perforation, rather the images are consistent with a broad PAU.  I tried to cross the aortic bifurcation but the diffuse iliac disease made this impossible, as neither the wire or catheter would track down the right iliac system for any extensive length.  I pulled the catheter down to the distal aorta and connected the catheter to the power injector circuit.  An automated bilateral leg runoff was  completed.  The findings are listed above.  Based on the images, there is no endovascular options in the right leg that will likely last for an extended period.  This 79 year female with  diabetes is not a candidate for a fem-tibial bypass.   The patient and family will consider whether to proceed with a possible Right BKA vs palliative management.  COMPLICATIONS: none  CONDITION: stable  Leonides Sake, MD [facility] [place] Office: [phone] Pager: [phone]  04/07/2013, 3:25 PM

## 2013-04-07 NOTE — Progress Notes (Signed)
Subjective: Patient was seen at the bedside. She has no complaints this morning. She denies chest pain, fevers, SOB, nausea, vomiting. She has been moving her bowels well on her new regimen of stool softeners.   We talked about the decision to have surgery. The patient is thinking about whether she would prefer a palliative approach to her right foot gangrene. She is aware of the implications of not having surgery, including infection and death. She is scheduled for an angiogram today at noon, and I explained to her and the daughter what that entailed. She will make a decision after the procedure.   Objective: Vital signs in last 24 hours: Filed Vitals:   04/06/13 1504 04/06/13 2056 04/07/13 0521 04/07/13 0949  BP: 120/72 111/66 126/64 144/69  Pulse: 65 79 58 87  Temp: 97.8 F (36.6 C) 99.3 F (37.4 C) 98.7 F (37.1 C)   TempSrc: Oral Oral Oral   Resp: 18 20 18    Height:      Weight:      SpO2: 97% 96% 96%    Weight change:   Intake/Output Summary (Last 24 hours) at 04/07/13 1151 Last data filed at 04/06/13 1849  Gross per 24 hour  Intake 860.75 ml  Output      0 ml  Net 860.75 ml   General: resting in bed, comfortable HEENT: PERRL, EOMI, no scleral icterus Cardiac: S1, S2 normal Pulm: clear to auscultation bilaterally, moving normal volumes of air Abd: soft, nontender, nondistended, BS present Ext: Stable, black wounds on distal 3rd and 4th toes of right foot, foul smelling, no purulent drainage, no edema, no erythema, shiny skin on dorsal foot Neuro: alert and oriented X3, cranial nerves II-XII grossly intact  Lab Results: Basic Metabolic Panel:  Recent Labs Lab 04/03/13 0544 04/07/13 0432  NA 137 137  K 4.3 3.8  CL 102 104  CO2 25 23  GLUCOSE 94 92  BUN 14 23  CREATININE 1.05 1.14*  CALCIUM 9.7 9.3   Liver Function Tests:  Recent Labs Lab 04/03/13 0544  AST 16  ALT 8  ALKPHOS 63  BILITOT 0.4  PROT 6.7  ALBUMIN 3.3*   CBC:  Recent Labs Lab  04/02/13 1250 04/07/13 0432  WBC 7.2 6.9  NEUTROABS 5.3  --   HGB 10.6* 9.4*  HCT 32.6* 28.0*  MCV 92.9 90.3  PLT 178 190   Urinalysis:  Recent Labs Lab 04/02/13 1407  COLORURINE YELLOW  LABSPEC 1.012  PHURINE 6.5  GLUCOSEU NEGATIVE  HGBUR NEGATIVE  BILIRUBINUR NEGATIVE  KETONESUR NEGATIVE  PROTEINUR 100*  UROBILINOGEN 0.2  NITRITE NEGATIVE  LEUKOCYTESUR MODERATE*   Studies/Results: No results found. Medications: I have reviewed the patient's current medications. Scheduled Meds: . aspirin EC  81 mg Oral Daily  . atenolol  25 mg Oral Daily  . enoxaparin (LOVENOX) injection  40 mg Subcutaneous Q24H  . nicotine  14 mg Transdermal Daily  . senna-docusate  2 tablet Oral BID  . sodium chloride  3 mL Intravenous Q12H  . sorbitol  50 mL Oral BID  . vancomycin  1,000 mg Intravenous Q24H   Continuous Infusions: . sodium chloride 75 mL/hr at 04/06/13 1250   PRN Meds:.HYDROcodone-acetaminophen, nitroGLYCERIN, polyethylene glycol Assessment/Plan:  Necrotic wound - Dry gangrene. All vital signs stable, afebrile, BMP/CBC wnl. ABIs and TBIs showed PVD (0.62 right, 0.85 left). Per Dr. Imogene Burn she will need aortogram, bilateral leg runoff, and possible right leg runoff to determine her revascularization option and possible salvage the right  foot. She is scheduled for these angiographic procedures today at noon. The patient is pondering whether she would prefer a palliative approach to her right foot gangrene. I told her this morning she needs to make a decision after her angiograms today. - Appreciate vascular surgery recs - Continue Vancomycin IV - Will continue to use doppler to auscultate the pulses and watch for any changes acutely  Diabetes - A1C 6.3. - CBG monitoring every morning - Will need primary care follow up as this is a new diagnosis  Constipation - Resolved. - Continue Senokot-S 2 tabs BID, miralax prn, sorbitol BID - Continue to monitor  Tobacco abuse - Patient  declines nicotine patch at this time. Advised her that smoking can hurt the chances of wounds healing and it is in her best interest to quit at this time. She is contemplating this.   DVT ppx - Lovenox subq  Dispo: Disposition is deferred at this time, awaiting improvement of current medical problems.  Anticipated discharge in approximately 2-3 day(s).   The patient does have a current PCP Abigail Miyamoto, MD) and does not need an [facility] hospital follow-up appointment after discharge.  The patient does not have transportation limitations that hinder transportation to clinic appointments.  .Services Needed at time of discharge: Y = Yes, Blank = No PT:   OT:   RN:   Equipment:   Other:     LOS: 5 days   Vivi Barrack, MD 04/07/2013, 11:51 AM

## 2013-04-07 NOTE — H&P (View-Only) (Signed)
[facility] [place]  Referred by:  Internal Medicine Teaching Service  Reason for referral: right foot gangrene  History of Present Illness  Lindsay Bentley is a 79 y.o. (21-Sep-1927) female who presents with chief complaint: right foot pain.  Onset of symptom occurred 2-3 weeks ago.  Pt noted some pain right toes and then drainage in the right foot.  The patient denies a history of intermittent claudication or rest pain.  Per the patient's daughter, pt ambulates minimally, mainly around the house.  Pain is described as aching, severity 3-6/10, and associated with manipulating the toes.  Patient has attempted to treat this pain with rest.  The patient has blackened macerated in her toes.  Atherosclerotic risk factors include: hypertension, long standing smoking.  Past Medical History  Diagnosis Date  . Hypertension   . Gangrene of foot     Past Surgical History  Procedure Laterality Date  . Total hip arthroplasty Right   . Mastectomy Bilateral   . Joint replacement      History   Social History  . Marital Status: Widowed    Spouse Name: N/A    Number of Children: N/A  . Years of Education: N/A   Occupational History  . Not on file.   Social History Main Topics  . Smoking status: Heavy Tobacco Smoker -- 0.50 packs/day for 50 years    Types: Cigarettes  . Smokeless tobacco: Never Used  . Alcohol Use: No  . Drug Use: No  . Sexual Activity: No   Other Topics Concern  . Not on file   Social History Narrative  . No narrative on file   FamHx: neither mother or father had accelerated atherosclerosis  No current facility-administered medications on file prior to encounter.   No current outpatient prescriptions on file prior to encounter.   No Known Allergies  REVIEW OF SYSTEMS:  (Positives checked otherwise negative)  CARDIOVASCULAR:  []  chest pain, []  chest pressure, []  palpitations, []  shortness of breath when laying flat, []  shortness of breath with  exertion,  []  pain in feet when walking, []  pain in feet when laying flat, []  history of blood clot in veins (DVT), []  history of phlebitis, []  swelling in legs, []  varicose veins  PULMONARY:  []  productive cough, []  asthma, []  wheezing  NEUROLOGIC:  []  weakness in arms or legs, []  numbness in arms or legs, []  difficulty speaking or slurred speech, []  temporary loss of vision in one eye, []  dizziness  HEMATOLOGIC:  []  bleeding problems, []  problems with blood clotting too easily  MUSCULOSKEL:  []  joint pain, []  joint swelling  GASTROINTEST:  []  vomiting blood, []  blood in stool     GENITOURINARY:  []  burning with urination, []  blood in urine  PSYCHIATRIC:  []  history of major depression  INTEGUMENTARY:  []  rashes, [x]  ulcers in R foot  CONSTITUTIONAL:  []  fever, []  chills  For VQI Use Only  PRE-ADM LIVING: Home  AMB STATUS: Ambulatory  CAD Sx: None  PRIOR CHF: None  STRESS TEST: [x]  No, [ ]  Normal, [ ]  + ischemia, [ ]  + MI, [ ]  Both  Physical Examination  Filed Vitals:   04/02/13 1555 04/02/13 1632 04/02/13 1642 04/03/13 0529  BP: 135/63 140/57  112/62  Pulse: 73 69  60  Temp:  99 F (37.2 C)  99.8 F (37.7 C)  TempSrc:  Oral  Oral  Resp:  18  16  Height:   5\' 3"  (1.6 m)  Weight:   148 lb 9.4 oz (67.4 kg)   SpO2: 95% 96%  97%   Body mass index is 26.33 kg/(m^2).  General: A&O x 3, WD, elderly  Head: [place]/AT  Ear/Nose/Throat: Hearing grossly intact, nares w/o erythema or drainage, oropharynx w/o Erythema/Exudate, Mallampati score: 3   Eyes: PERRLA, EOMI, arcus senilus  Neck: Supple, no nuchal rigidity, no palpable LAD  Pulmonary: Sym exp, good air movt, CTAB, no rales, rhonchi, & wheezing   Cardiac: RRR, Nl S1, S2, no Murmurs, rubs or gallops  Vascular: Vessel Right Left  Radial Palpable Palpable  Brachial Palpable Palpable  Carotid Palpable, without bruit Palpable, without bruit  Aorta Not palpable N/A  Femoral Palpable Palpable  Popliteal  Palpable Palpable  PT Not Palpable Not Palpable  DP Not Palpable  Not Palpable   Gastrointestinal: soft, NTND, -G/R, - HSM, - masses, - CVAT B  Musculoskeletal: M/S 5/5 throughout , Extremities without ischemic changes except  R 3rd and 4th toes with macerated skin overlying interdigit space with some dry gangrene underlying skin, cyanotic R 3rd and 4th toes  Neurologic: CN 2-12 grossly intact , Pain and light touch intact in extremities , Motor exam as listed above  Psychiatric: Judgment intact, Mood & affect appropriate for pt's clinical situation  Dermatologic: See M/S exam for extremity exam, no rashes otherwise noted  Lymph : No Cervical, Axillary, or Inguinal lymphadenopathy   Laboratory: CBC:    Component Value Date/Time   WBC 7.2 04/02/2013 1250   RBC 3.51* 04/02/2013 1250   HGB 10.6* 04/02/2013 1250   HCT 32.6* 04/02/2013 1250   PLT 178 04/02/2013 1250   MCV 92.9 04/02/2013 1250   MCH 30.2 04/02/2013 1250   MCHC 32.5 04/02/2013 1250   RDW 13.4 04/02/2013 1250   LYMPHSABS 1.1 04/02/2013 1250   MONOABS 0.6 04/02/2013 1250   EOSABS 0.2 04/02/2013 1250   BASOSABS 0.0 04/02/2013 1250    BMP:    Component Value Date/Time   NA 137 04/03/2013 0544   K 4.3 04/03/2013 0544   CL 102 04/03/2013 0544   CO2 25 04/03/2013 0544   GLUCOSE 94 04/03/2013 0544   BUN 14 04/03/2013 0544   CREATININE 1.05 04/03/2013 0544   CALCIUM 9.7 04/03/2013 0544   GFRNONAA 48* 04/03/2013 0544   GFRAA 56* 04/03/2013 0544    Coagulation: Lab Results  Component Value Date   INR 1.0 12/16/2008   No results found for this basename: PTT   Radiology: Dg Foot Complete Right  04/02/2013   CLINICAL DATA:  Foot injury. Non healing wound between 3rd and 4th toes  EXAM: RIGHT FOOT COMPLETE - 3+ VIEW  COMPARISON:  None.  FINDINGS: Normal alignment. Negative for fracture. No evidence of osteomyelitis.  IMPRESSION: No acute abnormality.   Electronically Signed   By: Marlan Palau M.D.   On: 04/02/2013 12:50     Non-Invasive Vascular Imaging  ABI (Date: 04/03/2013)  Ordered by myself.  Will be read once available.  Medical Decision Making  Lindsay Bentley is a 79 y.o. female who presents with: R foot ischemia in form of dry gangrene  The patient probably had some decaying soft tissue that is converting to dry gangrene.   I don't see any evidence of ascending cellulitis at this point, which would require a guillotine amputation. I agree with IV Abx started by IM. Would continue with dry to dry dressings to the interspace to let the wound dry out.  I suspect the ischemic tissue is down  to the level of the bone so, realistically toe amputation or higher is the next intervention. BLE ABI with toe pressures will help determine if this will heal. In this patient who still ambulates, it probably is reasonable to try to aggressively salvage the right leg with an angiographic intervention if the ABI suggests one is necessary. It's not clear to me if she is a reasonable surgical candidate for a major reconstruction, given her age.  Further preoperative risk stratification would be needed if that is being contemplated.  I discussed in depth with the patient the nature of atherosclerosis, and emphasized the importance of maximal medical management including strict control of blood pressure, blood glucose, and lipid levels, antiplatelet agents, obtaining regular exercise, and cessation of smoking.  The patient is aware that without maximal medical management the underlying atherosclerotic disease process will progress, limiting the benefit of any interventions. Would draw a lipid panel to get an idea if a statin is appropriate.   The patient is currently on an anti-platelet: ASA.  Thank you for allowing [place] to participate in this patient's care.  Leonides Sake, MD [facility] [place] Office: [phone] Pager: [phone]  04/03/2013, 9:39 AM

## 2013-04-07 NOTE — Clinical Social Work Note (Signed)
CSW and RNCM met with MD to discuss patient's disposition. If patient continues to refuse surgery, patient will discharge to SNF. If patient agrees to needed surgery, CSW and RNCM will regroup to determine when patient will discharge.   Lindsay Bentley, [place], [place], [phone]

## 2013-04-07 NOTE — Progress Notes (Signed)
Pt dressing to foot changed per wound care order. Pt states she already had dressing on back changed today. Will notify RN to change back dressing again in AM. Baron Hamper, RN

## 2013-04-07 NOTE — Clinical Social Work Placement (Addendum)
Clinical Social Work Department CLINICAL SOCIAL WORK PLACEMENT NOTE 04/07/2013  Patient:  TEMPERENCE, ZENOR  Account Number:  [id number] Admit date:  04/02/2013  Clinical Social Worker:  Lavell Luster  Date/time:  04/06/2013 03:46 PM  Clinical Social Work is seeking post-discharge placement for this patient at the following level of care:   SKILLED NURSING   (*CSW will update this form in Epic as items are completed)   04/06/2013  Patient/family provided with [facility] Department of Clinical Social Work's list of facilities offering this level of care within the geographic area requested by the patient (or if unable, by the patient's family).  04/06/2013  Patient/family informed of their freedom to choose among providers that offer the needed level of care, that participate in Medicare, Medicaid or managed care program needed by the patient, have an available bed and are willing to accept the patient.  04/06/2013  Patient/family informed of [facility]' ownership interest in [facility], as well as of the fact that they are under no obligation to receive care at this facility.  PASARR submitted to EDS on 04/06/2013 PASARR number received from EDS on 04/06/2013  FL2 transmitted to all facilities in geographic area requested by pt/family on  04/06/2013 FL2 transmitted to all facilities within larger geographic area on   Patient informed that his/her managed care company has contracts with or will negotiate with  certain facilities, including the following:     Patient/family informed of bed offers received:  04/07/2013 Patient chooses bed at [facility] Physician recommends and patient chooses bed at    Patient to be transferred [facility] on 04/08/2013  Patient to be transferred to facility by   The following physician request were entered in Epic:   Additional Comments:   Roddie Mc, [place], [phone]

## 2013-04-07 NOTE — Interval H&P Note (Signed)
[facility] [place]  History and Physical Update  The patient was interviewed and re-examined.  The patient's previous History and Physical has been reviewed and is unchanged.  There is no change in the plan of care: aortogram, bilateral leg runoff, and possible right leg intervention.  Leonides Sake, MD [facility] [place] Office: [phone] Pager: [phone]  04/07/2013, 1:48 PM

## 2013-04-08 DIAGNOSIS — I96 Gangrene, not elsewhere classified: Secondary | ICD-10-CM

## 2013-04-08 DIAGNOSIS — Z515 Encounter for palliative care: Secondary | ICD-10-CM

## 2013-04-08 DIAGNOSIS — L97509 Non-pressure chronic ulcer of other part of unspecified foot with unspecified severity: Secondary | ICD-10-CM

## 2013-04-08 LAB — CBC
MCH: 30.8 pg (ref 26.0–34.0)
MCV: 90.9 fL (ref 78.0–100.0)
Platelets: 200 10*3/uL (ref 150–400)
RBC: 3.28 MIL/uL — ABNORMAL LOW (ref 3.87–5.11)
RDW: 13 % (ref 11.5–15.5)
WBC: 7 10*3/uL (ref 4.0–10.5)

## 2013-04-08 LAB — BASIC METABOLIC PANEL
CO2: 24 mEq/L (ref 19–32)
Calcium: 9.5 mg/dL (ref 8.4–10.5)
Creatinine, Ser: 0.96 mg/dL (ref 0.50–1.10)
GFR calc Af Amer: 62 mL/min — ABNORMAL LOW (ref >90)
GFR calc non Af Amer: 54 mL/min — ABNORMAL LOW (ref >90)
Glucose, Bld: 138 mg/dL — ABNORMAL HIGH (ref 70–99)
Sodium: 138 mEq/L (ref 135–145)

## 2013-04-08 LAB — GLUCOSE, CAPILLARY
Glucose-Capillary: 78 mg/dL (ref 70–99)
Glucose-Capillary: 91 mg/dL (ref 70–99)

## 2013-04-08 MED ORDER — NICOTINE 14 MG/24HR TD PT24: 1.0000 | MEDICATED_PATCH | Freq: Every day | TRANSDERMAL | Status: DC

## 2013-04-08 MED ORDER — SENNOSIDES-DOCUSATE SODIUM 8.6-50 MG PO TABS: 2.0000 | ORAL_TABLET | Freq: Two times a day (BID) | ORAL | Status: DC

## 2013-04-08 MED ORDER — BISACODYL 10 MG RE SUPP: 10.0000 mg | Freq: Every day | RECTAL | Status: DC | PRN

## 2013-04-08 MED ORDER — SORBITOL 70 % SOLN: 50.0000 mL | Freq: Two times a day (BID) | Status: DC

## 2013-04-08 MED ORDER — HYDROCODONE-ACETAMINOPHEN 5-325 MG PO TABS: 1.0000 | ORAL_TABLET | ORAL | Status: DC | PRN

## 2013-04-08 NOTE — Progress Notes (Signed)
Thank you for consulting the Palliative Medicine Team at [facility] to meet your patient's and family's needs.   The reason that you asked [place] to see your patient is for goals of care discussion  We have scheduled your patient for a meeting: today Thursday 10/2 @ 11-11:30 am The Surrogate decision maker is: daughter Luciano Cutter [phone]; h: [phone]  Other family members that need to be present: granddaughter who is at bedside   Your patient is able to participate- patient awake, aware and talkative on this visit  Additional Narrative: spoke with patient and daughter and granddaughter at bedside; patient stated she was comfortable at this time, endorsed 'some pain' in foot last nite but 'not now'; patient and daughter want to discuss options daughter voiced questions r/t safety issues and patient needs should patient discharge home; encouraged family to think about/write down and bring their questions, concerns to this meeting    Valente David, RN 04/08/2013, 9:09 AM Palliative Medicine Team RN Liaison [phone]

## 2013-04-08 NOTE — Progress Notes (Addendum)
Subjective: Patient was seen at the bedside. She has no complaints this morning. She denies chest pain, fevers, SOB, nausea, vomiting. She is in no pain s/p angiogram yesterday, denies groin pain. Her foot is not bothering her currently.  We talked about the decision to have surgery. Per Dr. Imogene Burn, she is not a revascularization candidate. The patient told me she does not want to proceed with a BKA. She would also like to go home instead of the SNF. We talked about having the palliative care doctors stop by to discuss goals of care, and she is amenable. Both of her daughters will also be present.  She lives with her daughter and 2 autistic grandchildren. Patient is alone much of the day at home. Her daughter expressed that she often feels overwhelmed with her kids and is worried about taking care of her mother on top of it all.   Objective: Vital signs in last 24 hours: Filed Vitals:   04/07/13 1654 04/07/13 2130 04/07/13 2148 04/08/13 0534  BP: 119/90 111/73 124/60 135/60  Pulse: 65 79 58 67  Temp: 98.1 F (36.7 C) 98.4 F (36.9 C) 98.8 F (37.1 C) 99.6 F (37.6 C)  TempSrc: Oral Oral Oral Oral  Resp: 18 20 18 18   Height:      Weight:      SpO2: 100% 97% 99% 98%   Weight change:   Intake/Output Summary (Last 24 hours) at 04/08/13 0725 Last data filed at 04/07/13 2100  Gross per 24 hour  Intake    300 ml  Output    600 ml  Net   -300 ml   General: resting in bed, comfortable HEENT: PERRL, EOMI, no scleral icterus Cardiac: S1, S2 normal Pulm: clear to auscultation bilaterally, moving normal volumes of air Abd: soft, nontender, nondistended, BS present Ext: Stable, black wounds on distal 3rd and 4th toes of right foot, foul smelling, no purulent drainage, no edema, no erythema, shiny skin on dorsal foot Neuro: alert and oriented X3, cranial nerves II-XII grossly intact  Lab Results: Basic Metabolic Panel:  Recent Labs Lab 04/03/13 0544 04/07/13 0432  NA 137 137  K  4.3 3.8  CL 102 104  CO2 25 23  GLUCOSE 94 92  BUN 14 23  CREATININE 1.05 1.14*  CALCIUM 9.7 9.3   Liver Function Tests:  Recent Labs Lab 04/03/13 0544  AST 16  ALT 8  ALKPHOS 63  BILITOT 0.4  PROT 6.7  ALBUMIN 3.3*   CBC:  Recent Labs Lab 04/02/13 1250 04/07/13 0432  WBC 7.2 6.9  NEUTROABS 5.3  --   HGB 10.6* 9.4*  HCT 32.6* 28.0*  MCV 92.9 90.3  PLT 178 190   Urinalysis:  Recent Labs Lab 04/02/13 1407  COLORURINE YELLOW  LABSPEC 1.012  PHURINE 6.5  GLUCOSEU NEGATIVE  HGBUR NEGATIVE  BILIRUBINUR NEGATIVE  KETONESUR NEGATIVE  PROTEINUR 100*  UROBILINOGEN 0.2  NITRITE NEGATIVE  LEUKOCYTESUR MODERATE*   Studies/Results: No results found. Medications: I have reviewed the patient's current medications. Scheduled Meds: . aspirin EC  81 mg Oral Daily  . atenolol  25 mg Oral Daily  . enoxaparin (LOVENOX) injection  40 mg Subcutaneous Q24H  . nicotine  14 mg Transdermal Daily  . senna-docusate  2 tablet Oral BID  . sodium chloride  3 mL Intravenous Q12H  . sorbitol  50 mL Oral BID  . vancomycin  1,000 mg Intravenous Q24H   Continuous Infusions: . sodium chloride 75 mL/hr at 04/06/13 1250  PRN Meds:.acetaminophen, hydrALAZINE, HYDROcodone-acetaminophen, nitroGLYCERIN, ondansetron (ZOFRAN) IV, polyethylene glycol Assessment/Plan:  Necrotic wound, right foot - Dry gangrene. All vital signs stable, afebrile, BMP/CBC wnl. We discontinued her vancomycin yesterday as it was not indicated. ABIs and TBIs showed PVD (0.62 right, 0.85 left). She is s/p aortogram, bilateral leg runoff angiogram by Dr. Imogene Burn. Based on the results, she is not a revascularization candidate and will need a right BKA if she elects for surgery. She would rather proceed with a palliative approach. Cr is stable at 0.96 this am. - Appreciate vascular surgery recs - Palliative care consult placed, family meeting scheduled for 11am to determine goals of care and SNF vs. Home with home  health - IVF NS @75cc /hr s/p angiographic procedure - Norco prn pain  Hypeglycemia - A1C 6.3. CBGs 96, 80, 78. - CBG monitoring every morning - Will need primary care follow up as this is a new diagnosis; would start with lifestyle modification given borderline A1C  Constipation - Resolved. Patient moving bowels regularly. - Continue Senokot-S 2 tabs BID, miralax prn, sorbitol BID - Continue to monitor  Tobacco abuse - Patient declines nicotine patch at this time. Advised her that smoking can hurt the chances of wounds healing and it is in her best interest to quit at this time. She is contemplating this.   DVT ppx - Lovenox subq  Dispo: Disposition is deferred at this time, awaiting improvement of current medical problems.  Anticipated discharge in approximately 2-3 day(s).   The patient does have a current PCP Abigail Miyamoto, MD) and does not need an [facility] hospital follow-up appointment after discharge.  The patient does not have transportation limitations that hinder transportation to clinic appointments.  .Services Needed at time of discharge: Y = Yes, Blank = No PT:   OT:   RN:   Equipment:   Other:     LOS: 6 days   Vivi Barrack, MD 04/08/2013, 7:25 AM   Attending addendum: I interviewed Ms. Eidem and her 2 daughters with our team this morning. She is fully aware that there is no option for revascularization of her right lower leg and prefers a palliative approach over right below the knee amputation. She is not having any significant pain in her right foot or leg and no signs of active infection. She has met with the palliative care team today and she and her daughters are weighing options for skilled nursing facility versus going home with assistance.  Cliffton Asters, MD [facility] [phone] pager   [phone] cell 04/08/2013, 2:28 PM

## 2013-04-08 NOTE — Progress Notes (Signed)
CSW informed that [facility] authorization approved. CSW (Clinical Child psychotherapist) prepared pt dc packet and placed with shadow chart. Per nurse request, transport arranged for 4pm. Pt family is aware. Pt family did express some concerns about pt being discharged today because of need to be somewhere else. CSW explained that pt is medically stable and facility is ready to accept pt so dc will have to proceed for today. Pt family more understanding after conversation. Discharge summary sent to facility.  CSW signing off.  Nickolas Chalfin, LCSWA [phone]

## 2013-04-08 NOTE — Consult Note (Signed)
Patient Lindsay Bentley      DOB: 07/25/1927      BMW:[id number]     Consult Note from the Palliative Medicine Team at [facility]    Consult Requested by: Dr Bonnita Nasuti     PCP: Abigail Miyamoto, MD Reason for Consultation:Clarification of GOC and options     Phone Number:[phone]  Assessment of patients Current state: Lindsay Bentley is a 79 year old woman with history of back abscess s/p recent I&D by PCP, HTN, HL, tobacco abuse who presents from PCP (Dr. Marina Goodell) office with "wounds" on right 3rd and 4th toes x 2 weeks and concern for ischemia.  Admitted from ED  Now on antibiotics, vascular surgery has evaluated, aortogram supports no revascularization options for right foot.  Patient and family considering options; palliative management vs RBKA  This NP Lorinda Creed reviewed medical records, received report from team, assessed the patient and then meet at the patient's bedside along with her daughter Ozzie Hoyle # [phone] (whem pateitn verbalizes is the person  to discuss diagnosis prognosis, GOC, EOL wishes disposition and options.   A detailed discussion was had today regarding advanced directives.  Concepts specific to code status, artifical feeding and hydration, continued IV antibiotics and rehospitalization was had.  The difference between a aggressive medical intervention path  and a palliative comfort care path for this patient at this time was had.  Values and goals of care important to patient and family were attempted to be elicited.  Concept of Hospice and Palliative Care were discussed  Natural trajectory and expectations at EOL were discussed.  Questions and concerns addressed.  Hard Choices booklet left for review. Family encouraged to call with questions or concerns.  PMT will continue to support holistically.      Goals of Care: 1.  Code Status:  Full Code   2. Scope of Treatment: 1. Vital Signs: per unit  2. Respiratory/Oxygen:as needed for   treatment 3. Nutritional Support/Tube Feeds; no artifical feeding now or in the future 4. Antibiotics:yes 5. Blood Products:yes 6. IVF:yes 7. Review of Medications to be discontinued:continue present medications 8. Labs:yes 9. Telemetry:as needed for treatmetn    4. Disposition:  Hopeful for SNF for short term  Rehabilitation with ultimate goal to return home.  Family encouraged to investigate community options PCS, CAPPS, hospice benefit.  3. Symptom Management:   1. Weakness: Continue with PT/OT and dc to SNF for rehabilitaion 2. Pain: Vicodin 5-325 mg one tablet every 4 hrs prn 3. Bowel Regimen: Dulcolax supp prn   4. Psychosocial: Emotional support offered to patient and family      Patient Documents Completed or Given: Document Given Completed  Advanced Directives Pkt    MOST    DNR    Gone from My Sight    Hard Choices yes     Brief HPI:Lindsay Bentley is a 79 year old woman with history of back abscess s/p recent I&D by PCP, HTN, HL, tobacco abuse who presents from PCP (Dr. Marina Goodell) office with "wounds" on right 3rd and 4th toes x 2 weeks and concern for ischemia.  Admitted from ED  Now on antibiotics, vascular surgery has evaluated, aortogram supports no revascularization options for right foot.  Patient and family considering options; palliative management vs RBKA    ROS: weakness, intermittent hip and leg pain    PMH:  Past Medical History  Diagnosis Date  . Hypertension   . Gangrene of foot      PSH: Past Surgical History  Procedure Laterality Date  . Total hip arthroplasty Right   . Mastectomy Bilateral   . Joint replacement     I have reviewed the FH and SH and  If appropriate update it with new information. No Known Allergies Scheduled Meds: . aspirin EC  81 mg Oral Daily  . atenolol  25 mg Oral Daily  . enoxaparin (LOVENOX) injection  40 mg Subcutaneous Q24H  . nicotine  14 mg Transdermal Daily  . senna-docusate  2 tablet Oral BID  . sodium  chloride  3 mL Intravenous Q12H  . sorbitol  50 mL Oral BID   Continuous Infusions: . sodium chloride 75 mL/hr at 04/06/13 1250   PRN Meds:.acetaminophen, hydrALAZINE, HYDROcodone-acetaminophen, nitroGLYCERIN, ondansetron (ZOFRAN) IV, polyethylene glycol    BP 120/49  Pulse 75  Temp(Src) 99.6 F (37.6 C) (Oral)  Resp 18  Ht 5\' 3"  (1.6 m)  Wt 67.4 kg (148 lb 9.4 oz)  BMI 26.33 kg/m2  SpO2 98%   PPS:30 %   Intake/Output Summary (Last 24 hours) at 04/08/13 1051 Last data filed at 04/07/13 2100  Gross per 24 hour  Intake    300 ml  Output    600 ml  Net   -300 ml    Physical Exam:  General: NAD,  HEENT:  Mm, no exudate Chest:   CTA CVS: RRR Abdomen:soft NT +BS Ext: right foot dressing dry and intact, no edema of RLE Neuro:alet ant orietned, with capacity to make medcial decsions  Labs: CBC    Component Value Date/Time   WBC 7.0 04/08/2013 0912   RBC 3.28* 04/08/2013 0912   HGB 10.1* 04/08/2013 0912   HCT 29.8* 04/08/2013 0912   PLT 200 04/08/2013 0912   MCV 90.9 04/08/2013 0912   MCH 30.8 04/08/2013 0912   MCHC 33.9 04/08/2013 0912   RDW 13.0 04/08/2013 0912   LYMPHSABS 1.1 04/02/2013 1250   MONOABS 0.6 04/02/2013 1250   EOSABS 0.2 04/02/2013 1250   BASOSABS 0.0 04/02/2013 1250    BMET    Component Value Date/Time   NA 138 04/08/2013 0912   K 3.6 04/08/2013 0912   CL 103 04/08/2013 0912   CO2 24 04/08/2013 0912   GLUCOSE 138* 04/08/2013 0912   BUN 15 04/08/2013 0912   CREATININE 0.96 04/08/2013 0912   CALCIUM 9.5 04/08/2013 0912   GFRNONAA 54* 04/08/2013 0912   GFRAA 62* 04/08/2013 0912    CMP     Component Value Date/Time   NA 138 04/08/2013 0912   K 3.6 04/08/2013 0912   CL 103 04/08/2013 0912   CO2 24 04/08/2013 0912   GLUCOSE 138* 04/08/2013 0912   BUN 15 04/08/2013 0912   CREATININE 0.96 04/08/2013 0912   CALCIUM 9.5 04/08/2013 0912   PROT 6.7 04/03/2013 0544   ALBUMIN 3.3* 04/03/2013 0544   AST 16 04/03/2013 0544   ALT 8 04/03/2013 0544   ALKPHOS 63 04/03/2013  0544   BILITOT 0.4 04/03/2013 0544   GFRNONAA 54* 04/08/2013 0912   GFRAA 62* 04/08/2013 0912     Time In Time Out Total Time Spent with Patient Total Overall Time  1115 1245 80 min 90 min    Greater than 50%  of this time was spent counseling and coordinating care related to the above assessment and plan.  Lorinda Creed NP  Palliative Medicine Team Team Phone # [phone] Pager [phone]  Discussed with Dr Claudell Kyle

## 2013-04-08 NOTE — Discharge Summary (Signed)
Name: Lindsay Bentley MRN: [id number] DOB: 05/25/1928 79 y.o. PCP: Abigail Miyamoto, MD  Date of Admission: 04/02/2013 12:19 PM Date of Discharge: 04/08/2013 Attending Physician: Cliffton Asters, MD  Discharge Diagnosis:  1. Dry gangrene, right foot 2. Hyperglycemia 3. Constipation 4. Tobacco abuse  Discharge Medications:   Medication List         aspirin 81 MG tablet  Take 81 mg by mouth daily.     atenolol 25 MG tablet  Commonly known as:  TENORMIN  Take 25 mg by mouth daily.     HYDROcodone-acetaminophen 5-325 MG per tablet  Commonly known as:  NORCO/VICODIN  Take 1 tablet by mouth every 4 (four) hours as needed.     nicotine 14 mg/24hr patch  Commonly known as:  NICODERM CQ - dosed in mg/24 hours  Place 1 patch onto the skin daily.     nitroGLYCERIN 0.4 MG SL tablet  Commonly known as:  NITROSTAT  Place 0.4 mg under the tongue every 5 (five) minutes as needed for chest pain.     pravastatin 10 MG tablet  Commonly known as:  PRAVACHOL  Take 10 mg by mouth daily.     senna-docusate 8.6-50 MG per tablet  Commonly known as:  Senokot-S  Take 2 tablets by mouth 2 (two) times daily.     sorbitol 70 % Soln  Take 50 mLs by mouth 2 (two) times daily at 10 am and 4 pm.        Disposition and follow-up:   Lindsay Bentley was discharged from [facility] in Stable condition.  At the hospital follow up visit please address:  1.  Management of new diagnosis of pre-diabetes (A1C 6.3). We did discuss lifestyle modification and quitting tobacco.  2.  End of life goals. Patient wants to stay full code, but does not want to proceed with BKA for her gangrenous toes.  3.  Progress towards tobacco cessation.  4.  Labs / imaging needed at time of follow-up: BMP, CBC  5.  Pending labs/ test needing follow-up: None  Follow-up Appointments:     Follow-up Information   Follow up with Abigail Miyamoto, MD On 04/20/2013. (1:30 pm)    Specialty:  Family  Medicine   Contact information:   [address] [place] [address] [phone]       Discharge Instructions: Discharge Orders   Future Orders Complete By Expires   Diet - low sodium heart healthy  As directed    Increase activity slowly  As directed       Consultations: Treatment Team:  Palliative [facility] (Dr. Imogene Burn)  Procedures Performed:  Dg Foot Complete Right  04/02/2013   CLINICAL DATA:  Foot injury. Non healing wound between 3rd and 4th toes  EXAM: RIGHT FOOT COMPLETE - 3+ VIEW  COMPARISON:  None.  FINDINGS: Normal alignment. Negative for fracture. No evidence of osteomyelitis.  IMPRESSION: No acute abnormality.   Electronically Signed   By: Marlan Palau M.D.   On: 04/02/2013 12:50    Admission HPI:  Lindsay Bentley is a 79 year old woman with history of back abscess s/p recent I&D by PCP, HTN, HL, tobacco abuse who presents from PCP (Dr. Marina Goodell) office with "wounds" on right 3rd and 4th toes x 2 weeks and concern for ischemia.   Patient states that about two weeks ago she starting noticing that some of the toes on her right foot became dark and bloody. She has not  noticed any puss or foul smell, denies fever. She went to see her PCP today who told her he was worried there wasn't enough blood flow to her feet.   She also had an abscess on her upper left back s/p I&D, culture pending. She has ?completed course of abx. She gets her medications in the mail from [facility]   She smokes 1/2 PPD, no alcohol or illicits. She lives at home with her granddaughter and two great-granddaughters.   On ROS, denies fever/chills, lightheadedness, numbness/tingling. She endorses chronic constipation- last BM on 9/25, no blood. Denies dysuria, frequency, hematuria; she was recently treated for UTI with cipro. Her legs have also been cramping some when she is trying to sleep.    Hospital Course by problem list:   1. Dry gangrene, right foot - Xray of right foot showed no  fracture or evidence of osteomyelitis. No fever, tachycardia, tachypnea, or leucocytosis. All vital signs stable, afebrile, BMP/CBC wnl. Vascular surgery was consulted and determined that the patient had some decaying soft tissue that was converting to dry gangrene. ABIs showed PVD (0.62 right, 0.85 left). She was taken to the OR for diagnostic aortogram and bilateral leg runoff angiogram by Dr. Imogene Burn, which showed she was not a revascularization candidate and therefore would need a right BKA. The patient declined this surgery. Palliative care was consulted and met with the patient, her daughter, and her granddaughter. In terms of code status, she remains full code. In terms of disposition, she will go to SNF for a period of time, and then transition back to home if possible. We did start IV vancomycin on admission, but this was discontinued prior discharge as our suspicion for infection was low. Throughout her stay she denied pain, but I have prescribed her Norco prn just in case.  2. Hyperglycemia - A1C was checked and 6.3. CBGs wnl (96, 80, 78, 91). She will need primary care follow up as her pre-diabetes is a new diagnosis. We did discuss lifestyle modification and quitting tobacco. No oral medications were started.  3. Constipation - Patient was constipated on admission but began moving her bowels on a regimen of Senokot-S 2 tabs BID, sorbitol BID, miralax prn. This was prescribed at discharge.  4. Tobacco abuse - Patient declined nicotine patch. We advised her that smoking can hurt the chances of wounds healing and it is in her best interest to quit at this time. She is contemplating this, and in the meantime I prescribed her some patches and provided literature on smoking cessation.   Discharge Vitals:   BP 120/49  Pulse 75  Temp(Src) 99.6 F (37.6 C) (Oral)  Resp 18  Ht 5\' 3"  (1.6 m)  Wt 148 lb 9.4 oz (67.4 kg)  BMI 26.33 kg/m2  SpO2 98%  Discharge Labs:  Results for orders placed during  the hospital encounter of 04/02/13 (from the past 24 hour(s))  GLUCOSE, CAPILLARY     Status: None   Collection Time    04/07/13  3:05 PM      Result Value Range   Glucose-Capillary 82  70 - 99 mg/dL  GLUCOSE, CAPILLARY     Status: None   Collection Time    04/07/13  9:22 PM      Result Value Range   Glucose-Capillary 80  70 - 99 mg/dL  GLUCOSE, CAPILLARY     Status: None   Collection Time    04/08/13  7:52 AM      Result Value Range  Glucose-Capillary 78  70 - 99 mg/dL  BASIC METABOLIC PANEL     Status: Abnormal   Collection Time    04/08/13  9:12 AM      Result Value Range   Sodium 138  135 - 145 mEq/L   Potassium 3.6  3.5 - 5.1 mEq/L   Chloride 103  96 - 112 mEq/L   CO2 24  19 - 32 mEq/L   Glucose, Bld 138 (*) 70 - 99 mg/dL   BUN 15  6 - 23 mg/dL   Creatinine, Ser 1.30  0.50 - 1.10 mg/dL   Calcium 9.5  8.4 - 86.5 mg/dL   GFR calc non Af Amer 54 (*) >90 mL/min   GFR calc Af Amer 62 (*) >90 mL/min  CBC     Status: Abnormal   Collection Time    04/08/13  9:12 AM      Result Value Range   WBC 7.0  4.0 - 10.5 K/uL   RBC 3.28 (*) 3.87 - 5.11 MIL/uL   Hemoglobin 10.1 (*) 12.0 - 15.0 g/dL   HCT 78.4 (*) 69.6 - 29.5 %   MCV 90.9  78.0 - 100.0 fL   MCH 30.8  26.0 - 34.0 pg   MCHC 33.9  30.0 - 36.0 g/dL   RDW 28.4  13.2 - 44.0 %   Platelets 200  150 - 400 K/uL  GLUCOSE, CAPILLARY     Status: None   Collection Time    04/08/13 12:04 PM      Result Value Range   Glucose-Capillary 91  70 - 99 mg/dL    Signed: Vivi Barrack, MD 04/08/2013, 1:19 PM   Time Spent on Discharge: 30 minutes Services Ordered on Discharge: None Equipment Ordered on Discharge: None

## 2013-04-08 NOTE — Progress Notes (Signed)
[facility]  Daily Progress Note  Assessment/Planning: POD #1 s/p Aortogram, bilateral leg runoff   Pt and family have elected a palliative approach at this point  Available as needed  Subjective  - 1 Day Post-Op  No bleeding overnight from puncture site  Objective Filed Vitals:   04/07/13 2130 04/07/13 2148 04/08/13 0534 04/08/13 0950  BP: 111/73 124/60 135/60 120/49  Pulse: 79 58 67 75  Temp: 98.4 F (36.9 C) 98.8 F (37.1 C) 99.6 F (37.6 C)   TempSrc: Oral Oral Oral   Resp: 20 18 18    Height:      Weight:      SpO2: 97% 99% 98%     Intake/Output Summary (Last 24 hours) at 04/08/13 1152 Last data filed at 04/07/13 2100  Gross per 24 hour  Intake    300 ml  Output    600 ml  Net   -300 ml    R foot Not evaluated today VASC  L puncture site without hematoma or echymosis  Laboratory CBC    Component Value Date/Time   WBC 7.0 04/08/2013 0912   HGB 10.1* 04/08/2013 0912   HCT 29.8* 04/08/2013 0912   PLT 200 04/08/2013 0912    BMET    Component Value Date/Time   NA 138 04/08/2013 0912   K 3.6 04/08/2013 0912   CL 103 04/08/2013 0912   CO2 24 04/08/2013 0912   GLUCOSE 138* 04/08/2013 0912   BUN 15 04/08/2013 0912   CREATININE 0.96 04/08/2013 0912   CALCIUM 9.5 04/08/2013 0912   GFRNONAA 54* 04/08/2013 0912   GFRAA 62* 04/08/2013 0912    Leonides Sake, MD [facility] Office: [phone] Pager: [phone]  04/08/2013, 11:52 AM

## 2013-04-08 NOTE — Progress Notes (Signed)
CSW (Clinical Child psychotherapist) received call from [facility] regarding SNF auth request. CSW updated [employer] on palliative care consult and informed [employer] rep that when a decision is made CSW will send in updated clinicals if SNF [facility] is necessary  Sharol Harness, LCSWA [phone]

## 2013-04-08 NOTE — Progress Notes (Signed)
CSW (Clinical Child psychotherapist) informed by MD that pt and pt family have decided to proceed with SNF placement. CSW spoke with pt family to confirm facility choice. CSW updated that [facility] that pt will not be receiving surgery but will require SNF. Updated clinicals sent to [facility] as requested by rep. CSW spoke with [facility]. If authorization is obtained today pt can dc to their facility.  Melody Cirrincione, LCSWA [phone]

## 2013-04-28 ENCOUNTER — Other Ambulatory Visit: Payer: Self-pay

## 2013-04-30 ENCOUNTER — Encounter (HOSPITAL_COMMUNITY): Payer: Self-pay | Admitting: *Deleted

## 2013-04-30 ENCOUNTER — Other Ambulatory Visit (HOSPITAL_COMMUNITY): Payer: Self-pay | Admitting: *Deleted

## 2013-04-30 NOTE — Progress Notes (Signed)
Spoke with Porfirio Mylar, nurse at [facility] to get health hx, allergies and meds verified. Gave her instructions for DOS - time of arrival, meds to be given and NPO after MN on Sunday. Left message for pt's daughter to call back so I can give her the time of arrival and where to meet her mother on Monday.

## 2013-05-02 MED ORDER — SODIUM CHLORIDE 0.9 % IV SOLN: INTRAVENOUS | Status: DC

## 2013-05-02 MED ORDER — CEFUROXIME SODIUM 1.5 G IJ SOLR
1.5000 g | INTRAMUSCULAR | Status: AC
  Administered 2013-05-03: 1.5 g via INTRAVENOUS
  Filled 2013-05-02: qty 1.5

## 2013-05-03 ENCOUNTER — Inpatient Hospital Stay (HOSPITAL_COMMUNITY): Payer: Medicare PPO | Admitting: Certified Registered Nurse Anesthetist

## 2013-05-03 ENCOUNTER — Encounter (HOSPITAL_COMMUNITY): Payer: Self-pay | Admitting: Surgery

## 2013-05-03 ENCOUNTER — Encounter (HOSPITAL_COMMUNITY): Payer: Medicare PPO | Admitting: Certified Registered Nurse Anesthetist

## 2013-05-03 ENCOUNTER — Inpatient Hospital Stay (HOSPITAL_COMMUNITY): Payer: Medicare PPO

## 2013-05-03 ENCOUNTER — Telehealth: Payer: Self-pay | Admitting: Vascular Surgery

## 2013-05-03 ENCOUNTER — Inpatient Hospital Stay (HOSPITAL_COMMUNITY)
Admission: RE | Admit: 2013-05-03 | Discharge: 2013-05-05 | DRG: 240 | Disposition: A | Discharge status: Skilled Nursing Facility | Payer: Medicare PPO | Source: Ambulatory Visit | Attending: Vascular Surgery | Admitting: Vascular Surgery

## 2013-05-03 ENCOUNTER — Encounter (HOSPITAL_COMMUNITY)
Admission: RE | Disposition: A | Discharge status: Skilled Nursing Facility | Payer: Self-pay | Source: Ambulatory Visit | Attending: Vascular Surgery

## 2013-05-03 DIAGNOSIS — Z7982 Long term (current) use of aspirin: Secondary | ICD-10-CM

## 2013-05-03 DIAGNOSIS — Z96649 Presence of unspecified artificial hip joint: Secondary | ICD-10-CM

## 2013-05-03 DIAGNOSIS — Z87891 Personal history of nicotine dependence: Secondary | ICD-10-CM

## 2013-05-03 DIAGNOSIS — I96 Gangrene, not elsewhere classified: Secondary | ICD-10-CM | POA: Diagnosis present

## 2013-05-03 DIAGNOSIS — I70269 Atherosclerosis of native arteries of extremities with gangrene, unspecified extremity: Secondary | ICD-10-CM

## 2013-05-03 DIAGNOSIS — L97509 Non-pressure chronic ulcer of other part of unspecified foot with unspecified severity: Secondary | ICD-10-CM | POA: Diagnosis present

## 2013-05-03 DIAGNOSIS — I10 Essential (primary) hypertension: Secondary | ICD-10-CM | POA: Diagnosis present

## 2013-05-03 DIAGNOSIS — I739 Peripheral vascular disease, unspecified: Secondary | ICD-10-CM | POA: Diagnosis present

## 2013-05-03 DIAGNOSIS — E1159 Type 2 diabetes mellitus with other circulatory complications: Principal | ICD-10-CM | POA: Diagnosis present

## 2013-05-03 DIAGNOSIS — Z79899 Other long term (current) drug therapy: Secondary | ICD-10-CM

## 2013-05-03 DIAGNOSIS — D62 Acute posthemorrhagic anemia: Secondary | ICD-10-CM | POA: Diagnosis not present

## 2013-05-03 DIAGNOSIS — E785 Hyperlipidemia, unspecified: Secondary | ICD-10-CM | POA: Diagnosis present

## 2013-05-03 HISTORY — DX: Hyperlipidemia, unspecified: E78.5

## 2013-05-03 HISTORY — DX: Peripheral vascular disease, unspecified: I73.9

## 2013-05-03 HISTORY — PX: AMPUTATION: SHX166

## 2013-05-03 HISTORY — DX: Hyperglycemia, unspecified: R73.9

## 2013-05-03 HISTORY — DX: Constipation, unspecified: K59.00

## 2013-05-03 LAB — BASIC METABOLIC PANEL
BUN: 14 mg/dL (ref 6–23)
CO2: 25 mEq/L (ref 19–32)
Calcium: 9.8 mg/dL (ref 8.4–10.5)
Chloride: 101 mEq/L (ref 96–112)
Creatinine, Ser: 1.08 mg/dL (ref 0.50–1.10)
Glucose, Bld: 108 mg/dL — ABNORMAL HIGH (ref 70–99)

## 2013-05-03 LAB — CBC
HCT: 30.1 % — ABNORMAL LOW (ref 36.0–46.0)
HCT: 28.6 % — ABNORMAL LOW (ref 36.0–46.0)
MCH: 29.7 pg (ref 26.0–34.0)
MCH: 29.8 pg (ref 26.0–34.0)
MCHC: 32.5 g/dL (ref 30.0–36.0)
MCV: 92 fL (ref 78.0–100.0)
MCV: 91.7 fL (ref 78.0–100.0)
Platelets: 197 10*3/uL (ref 150–400)
RBC: 3.27 MIL/uL — ABNORMAL LOW (ref 3.87–5.11)
RDW: 13.4 % (ref 11.5–15.5)
RDW: 13.1 % (ref 11.5–15.5)
WBC: 5.9 10*3/uL (ref 4.0–10.5)
WBC: 7.9 10*3/uL (ref 4.0–10.5)

## 2013-05-03 LAB — CREATININE, SERUM: GFR calc non Af Amer: 47 mL/min — ABNORMAL LOW (ref >90)

## 2013-05-03 SURGERY — AMPUTATION BELOW KNEE
Anesthesia: General | Site: Leg Lower | Laterality: Right | Wound class: Clean

## 2013-05-03 MED ORDER — PHENOL 1.4 % MT LIQD
1.0000 | OROMUCOSAL | Status: DC | PRN
  Filled 2013-05-03: qty 177

## 2013-05-03 MED ORDER — GLYCOPYRROLATE 0.2 MG/ML IJ SOLN
INTRAMUSCULAR | Status: DC | PRN
  Administered 2013-05-03: .4 mg via INTRAVENOUS

## 2013-05-03 MED ORDER — DEXTROSE 5 % IV SOLN
1.5000 g | Freq: Two times a day (BID) | INTRAVENOUS | Status: DC
  Filled 2013-05-03 (×2): qty 1.5

## 2013-05-03 MED ORDER — SIMVASTATIN 5 MG PO TABS
5.0000 mg | ORAL_TABLET | Freq: Every day | ORAL | Status: DC
  Administered 2013-05-04: 5 mg via ORAL
  Filled 2013-05-03 (×2): qty 1

## 2013-05-03 MED ORDER — ASPIRIN 81 MG PO CHEW
81.0000 mg | CHEWABLE_TABLET | Freq: Every day | ORAL | Status: DC
  Administered 2013-05-04 – 2013-05-05 (×2): 81 mg via ORAL
  Filled 2013-05-03 (×2): qty 1

## 2013-05-03 MED ORDER — PANTOPRAZOLE SODIUM 40 MG PO TBEC
40.0000 mg | DELAYED_RELEASE_TABLET | Freq: Every day | ORAL | Status: DC
  Administered 2013-05-03 – 2013-05-05 (×3): 40 mg via ORAL
  Filled 2013-05-03 (×3): qty 1

## 2013-05-03 MED ORDER — SODIUM CHLORIDE 0.9 % IV SOLN
INTRAVENOUS | Status: DC
  Administered 2013-05-03 – 2013-05-04 (×2): via INTRAVENOUS

## 2013-05-03 MED ORDER — POTASSIUM CHLORIDE CRYS ER 20 MEQ PO TBCR: 20.0000 meq | EXTENDED_RELEASE_TABLET | Freq: Once | ORAL | Status: AC | PRN

## 2013-05-03 MED ORDER — PROPOFOL 10 MG/ML IV BOLUS
INTRAVENOUS | Status: DC | PRN
  Administered 2013-05-03: 100 mg via INTRAVENOUS

## 2013-05-03 MED ORDER — LABETALOL HCL 5 MG/ML IV SOLN
10.0000 mg | INTRAVENOUS | Status: DC | PRN
  Filled 2013-05-03: qty 4

## 2013-05-03 MED ORDER — ONDANSETRON HCL 4 MG/2ML IJ SOLN
INTRAMUSCULAR | Status: DC | PRN
  Administered 2013-05-03: 4 mg via INTRAVENOUS

## 2013-05-03 MED ORDER — PHENYLEPHRINE HCL 10 MG/ML IJ SOLN
INTRAMUSCULAR | Status: DC | PRN
  Administered 2013-05-03: 80 ug via INTRAVENOUS
  Administered 2013-05-03: 40 ug via INTRAVENOUS

## 2013-05-03 MED ORDER — LIDOCAINE HCL (CARDIAC) 20 MG/ML IV SOLN
INTRAVENOUS | Status: DC | PRN
  Administered 2013-05-03: 40 mg via INTRAVENOUS

## 2013-05-03 MED ORDER — NEOSTIGMINE METHYLSULFATE 1 MG/ML IJ SOLN
INTRAMUSCULAR | Status: DC | PRN
  Administered 2013-05-03: 3 mg via INTRAVENOUS

## 2013-05-03 MED ORDER — ONDANSETRON HCL 4 MG/2ML IJ SOLN: 4.0000 mg | Freq: Once | INTRAMUSCULAR | Status: DC | PRN

## 2013-05-03 MED ORDER — METOPROLOL TARTRATE 1 MG/ML IV SOLN: 2.0000 mg | INTRAVENOUS | Status: DC | PRN

## 2013-05-03 MED ORDER — NICOTINE 14 MG/24HR TD PT24
14.0000 mg | MEDICATED_PATCH | Freq: Every day | TRANSDERMAL | Status: DC
  Administered 2013-05-03 – 2013-05-05 (×3): 14 mg via TRANSDERMAL
  Filled 2013-05-03 (×3): qty 1

## 2013-05-03 MED ORDER — SENNOSIDES-DOCUSATE SODIUM 8.6-50 MG PO TABS
2.0000 | ORAL_TABLET | Freq: Two times a day (BID) | ORAL | Status: DC
  Administered 2013-05-03 – 2013-05-05 (×4): 2 via ORAL
  Filled 2013-05-03 (×7): qty 2

## 2013-05-03 MED ORDER — ONDANSETRON HCL 4 MG/2ML IJ SOLN: 4.0000 mg | Freq: Four times a day (QID) | INTRAMUSCULAR | Status: DC | PRN

## 2013-05-03 MED ORDER — ROCURONIUM BROMIDE 100 MG/10ML IV SOLN
INTRAVENOUS | Status: DC | PRN
  Administered 2013-05-03: 30 mg via INTRAVENOUS
  Administered 2013-05-03: 10 mg via INTRAVENOUS

## 2013-05-03 MED ORDER — LACTATED RINGERS IV SOLN
INTRAVENOUS | Status: DC | PRN
  Administered 2013-05-03: 09:00:00 via INTRAVENOUS

## 2013-05-03 MED ORDER — DEXTROSE 5 % IV SOLN
1.5000 g | Freq: Two times a day (BID) | INTRAVENOUS | Status: AC
  Administered 2013-05-03 – 2013-05-04 (×2): 1.5 g via INTRAVENOUS
  Filled 2013-05-03 (×2): qty 1.5

## 2013-05-03 MED ORDER — HYDROMORPHONE HCL PF 1 MG/ML IJ SOLN
0.2500 mg | INTRAMUSCULAR | Status: DC | PRN
  Administered 2013-05-03 (×2): 0.5 mg via INTRAVENOUS

## 2013-05-03 MED ORDER — 0.9 % SODIUM CHLORIDE (POUR BTL) OPTIME
TOPICAL | Status: DC | PRN
  Administered 2013-05-03: 1000 mL

## 2013-05-03 MED ORDER — LACTATED RINGERS IV SOLN
INTRAVENOUS | Status: DC
  Administered 2013-05-03: 09:00:00 via INTRAVENOUS

## 2013-05-03 MED ORDER — ACETAMINOPHEN 325 MG PO TABS: 325.0000 mg | ORAL_TABLET | ORAL | Status: DC | PRN

## 2013-05-03 MED ORDER — ACETAMINOPHEN 650 MG RE SUPP: 325.0000 mg | RECTAL | Status: DC | PRN

## 2013-05-03 MED ORDER — ATENOLOL 25 MG PO TABS
25.0000 mg | ORAL_TABLET | Freq: Once | ORAL | Status: AC
  Administered 2013-05-03: 25 mg via ORAL
  Filled 2013-05-03 (×2): qty 1

## 2013-05-03 MED ORDER — FENTANYL CITRATE 0.05 MG/ML IJ SOLN
INTRAMUSCULAR | Status: DC | PRN
  Administered 2013-05-03 (×2): 50 ug via INTRAVENOUS
  Administered 2013-05-03: 100 ug via INTRAVENOUS
  Administered 2013-05-03: 50 ug via INTRAVENOUS

## 2013-05-03 MED ORDER — ENOXAPARIN SODIUM 40 MG/0.4ML ~~LOC~~ SOLN
40.0000 mg | SUBCUTANEOUS | Status: DC
  Administered 2013-05-04 – 2013-05-05 (×2): 40 mg via SUBCUTANEOUS
  Filled 2013-05-03 (×2): qty 0.4

## 2013-05-03 MED ORDER — HYDROCODONE-ACETAMINOPHEN 5-325 MG PO TABS
1.0000 | ORAL_TABLET | ORAL | Status: DC | PRN
  Administered 2013-05-03 – 2013-05-05 (×2): 1 via ORAL
  Filled 2013-05-03 (×2): qty 1

## 2013-05-03 MED ORDER — HYDRALAZINE HCL 20 MG/ML IJ SOLN: 10.0000 mg | INTRAMUSCULAR | Status: DC | PRN

## 2013-05-03 MED ORDER — MAGNESIUM SULFATE 40 MG/ML IJ SOLN
2.0000 g | Freq: Once | INTRAMUSCULAR | Status: AC | PRN
  Filled 2013-05-03: qty 50

## 2013-05-03 MED ORDER — NITROGLYCERIN 0.4 MG SL SUBL: 0.4000 mg | SUBLINGUAL_TABLET | SUBLINGUAL | Status: DC | PRN

## 2013-05-03 MED ORDER — ATENOLOL 25 MG PO TABS
25.0000 mg | ORAL_TABLET | Freq: Every day | ORAL | Status: DC
  Administered 2013-05-04 – 2013-05-05 (×2): 25 mg via ORAL
  Filled 2013-05-03 (×2): qty 1

## 2013-05-03 MED ORDER — HYDROMORPHONE HCL PF 1 MG/ML IJ SOLN
0.5000 mg | INTRAMUSCULAR | Status: DC | PRN
  Administered 2013-05-03 – 2013-05-04 (×4): 0.5 mg via INTRAVENOUS
  Administered 2013-05-04: 1 mg via INTRAVENOUS
  Filled 2013-05-03 (×5): qty 1

## 2013-05-03 MED ORDER — HYDROMORPHONE HCL PF 1 MG/ML IJ SOLN
INTRAMUSCULAR | Status: AC
  Administered 2013-05-03: 0.5 mg via INTRAVENOUS
  Filled 2013-05-03: qty 1

## 2013-05-03 SURGICAL SUPPLY — 56 items
BANDAGE ELASTIC 4 VELCRO ST LF (GAUZE/BANDAGES/DRESSINGS) ×2 IMPLANT
BANDAGE ELASTIC 6 VELCRO ST LF (GAUZE/BANDAGES/DRESSINGS) ×2 IMPLANT
BANDAGE ESMARK 6X9 LF (GAUZE/BANDAGES/DRESSINGS) ×1 IMPLANT
BANDAGE GAUZE ELAST BULKY 4 IN (GAUZE/BANDAGES/DRESSINGS) ×2 IMPLANT
BLADE SAW SAG 29X58X.64 (BLADE) ×2 IMPLANT
BNDG COHESIVE 6X5 TAN STRL LF (GAUZE/BANDAGES/DRESSINGS) ×2 IMPLANT
BNDG ESMARK 6X9 LF (GAUZE/BANDAGES/DRESSINGS) ×2
CANISTER SUCTION 2500CC (MISCELLANEOUS) ×2 IMPLANT
CLIP TI MEDIUM 6 (CLIP) ×2 IMPLANT
COVER SURGICAL LIGHT HANDLE (MISCELLANEOUS) ×2 IMPLANT
COVER TABLE BACK 60X90 (DRAPES) ×2 IMPLANT
CUFF TOURNIQUET SINGLE 24IN (TOURNIQUET CUFF) IMPLANT
CUFF TOURNIQUET SINGLE 34IN LL (TOURNIQUET CUFF) ×2 IMPLANT
CUFF TOURNIQUET SINGLE 44IN (TOURNIQUET CUFF) IMPLANT
DRAIN CHANNEL 19F RND (DRAIN) IMPLANT
DRAPE ORTHO SPLIT 77X108 STRL (DRAPES) ×2
DRAPE PROXIMA HALF (DRAPES) ×4 IMPLANT
DRAPE SURG ORHT 6 SPLT 77X108 (DRAPES) ×2 IMPLANT
DRSG ADAPTIC 3X8 NADH LF (GAUZE/BANDAGES/DRESSINGS) ×2 IMPLANT
ELECT REM PT RETURN 9FT ADLT (ELECTROSURGICAL) ×2
ELECTRODE REM PT RTRN 9FT ADLT (ELECTROSURGICAL) ×1 IMPLANT
EVACUATOR SILICONE 100CC (DRAIN) IMPLANT
GLOVE BIO SURGEON STRL SZ 6.5 (GLOVE) ×2 IMPLANT
GLOVE BIO SURGEON STRL SZ7 (GLOVE) ×2 IMPLANT
GLOVE BIOGEL PI IND STRL 6.5 (GLOVE) ×1 IMPLANT
GLOVE BIOGEL PI IND STRL 7.0 (GLOVE) ×2 IMPLANT
GLOVE BIOGEL PI IND STRL 7.5 (GLOVE) ×1 IMPLANT
GLOVE BIOGEL PI INDICATOR 6.5 (GLOVE) ×1
GLOVE BIOGEL PI INDICATOR 7.0 (GLOVE) ×2
GLOVE BIOGEL PI INDICATOR 7.5 (GLOVE) ×1
GLOVE SS BIOGEL STRL SZ 6.5 (GLOVE) ×1 IMPLANT
GLOVE SUPERSENSE BIOGEL SZ 6.5 (GLOVE) ×1
GOWN STRL NON-REIN LRG LVL3 (GOWN DISPOSABLE) ×6 IMPLANT
KIT BASIN OR (CUSTOM PROCEDURE TRAY) ×2 IMPLANT
KIT ROOM TURNOVER OR (KITS) ×2 IMPLANT
NS IRRIG 1000ML POUR BTL (IV SOLUTION) ×2 IMPLANT
PACK GENERAL/GYN (CUSTOM PROCEDURE TRAY) ×2 IMPLANT
PAD ARMBOARD 7.5X6 YLW CONV (MISCELLANEOUS) ×4 IMPLANT
PADDING CAST COTTON 6X4 STRL (CAST SUPPLIES) IMPLANT
SLEEVE SURGEON STRL (DRAPES) ×2 IMPLANT
SPONGE GAUZE 4X4 12PLY (GAUZE/BANDAGES/DRESSINGS) ×2 IMPLANT
STAPLER VISISTAT 35W (STAPLE) ×4 IMPLANT
STOCKINETTE IMPERVIOUS LG (DRAPES) ×2 IMPLANT
SUT ETHILON 3 0 PS 1 (SUTURE) IMPLANT
SUT SILK 0 TIES 10X30 (SUTURE) IMPLANT
SUT SILK 2 0 (SUTURE) ×1
SUT SILK 2-0 18XBRD TIE 12 (SUTURE) ×1 IMPLANT
SUT SILK 3 0 (SUTURE) ×1
SUT SILK 3-0 18XBRD TIE 12 (SUTURE) ×1 IMPLANT
SUT VIC AB 2-0 CT1 18 (SUTURE) ×6 IMPLANT
SUT VIC AB 3-0 SH 18 (SUTURE) ×4 IMPLANT
TAPE UMBILICAL COTTON 1/8X30 (MISCELLANEOUS) ×2 IMPLANT
TOWEL OR 17X24 6PK STRL BLUE (TOWEL DISPOSABLE) ×2 IMPLANT
TOWEL OR 17X26 10 PK STRL BLUE (TOWEL DISPOSABLE) ×2 IMPLANT
UNDERPAD 30X30 INCONTINENT (UNDERPADS AND DIAPERS) ×2 IMPLANT
WATER STERILE IRR 1000ML POUR (IV SOLUTION) ×2 IMPLANT

## 2013-05-03 NOTE — Anesthesia Preprocedure Evaluation (Addendum)
Anesthesia Evaluation  Patient identified by MRN, date of birth, ID band Patient awake    Reviewed: Allergy & Precautions, H&P , NPO status , Patient's Chart, lab work & pertinent test results, reviewed documented beta blocker date and time   History of Anesthesia Complications Negative for: history of anesthetic complications  Airway Mallampati: II TM Distance: >3 FB Neck ROM: Full    Dental  (+) Edentulous Upper and Edentulous Lower   Pulmonary neg pulmonary ROS,  breath sounds clear to auscultation  Pulmonary exam normal       Cardiovascular Rhythm:Regular     Neuro/Psych negative neurological ROS  negative psych ROS   GI/Hepatic   Endo/Other  negative endocrine ROS  Renal/GU      Musculoskeletal   Abdominal Normal abdominal exam  (+)   Peds  Hematology   Anesthesia Other Findings   Reproductive/Obstetrics                          Anesthesia Physical Anesthesia Plan  ASA: III  Anesthesia Plan: General   Post-op Pain Management:    Induction: Intravenous  Airway Management Planned: Oral ETT  Additional Equipment:   Intra-op Plan:   Post-operative Plan: Extubation in OR  Informed Consent: I have reviewed the patients History and Physical, chart, labs and discussed the procedure including the risks, benefits and alternatives for the proposed anesthesia with the patient or authorized representative who has indicated his/her understanding and acceptance.     Plan Discussed with: CRNA, Anesthesiologist and Surgeon  Anesthesia Plan Comments:        Anesthesia Quick Evaluation

## 2013-05-03 NOTE — Progress Notes (Signed)
Spoke with Lindsay Bentley at [facility] and he informed Nurse of last times medications were taken, and he also stated that patient did not have any medications this morning. Will order 25 mg Atenolol and administer.

## 2013-05-03 NOTE — Transfer of Care (Signed)
Immediate Anesthesia Transfer of Care Note  Patient: Lindsay Bentley  Procedure(s) Performed: Procedure(s): AMPUTATION BELOW KNEE - RIGHT (Right)  Patient Location: PACU  Anesthesia Type:General  Level of Consciousness: awake, alert  and oriented  Airway & Oxygen Therapy: Patient Spontanous Breathing  Post-op Assessment: Report given to PACU RN  Post vital signs: Reviewed and stable  Complications: No apparent anesthesia complications

## 2013-05-03 NOTE — Op Note (Signed)
OPERATIVE NOTE   PROCEDURE: Right below-the-knee amputation  PRE-OPERATIVE DIAGNOSIS: right foot gangrene  POST-OPERATIVE DIAGNOSIS: same as above  SURGEON: Leonides Sake, MD  ASSISTANT(S): Della Goo, [facility]   ANESTHESIA: general  ESTIMATED BLOOD LOSS: 100 cc  FINDING(S): 1.  Viable muscle in calf  SPECIMEN(S):  right below-the-knee amputation  INDICATIONS:   Lindsay Bentley is a 79 y.o. female who presents with right foot gangrene.  The patient is scheduled for a right below-the-knee amputation.  I discussed in depth with the patient the risks, benefits, and alternatives to this procedure.  The patient is aware that the risk of this operation included but are not limited to:  bleeding, infection, myocardial infarction, stroke, death, failure to heal amputation wound, and possible need for more proximal amputation.  The patient is aware of the risks and agrees proceed forward with the procedure.  DESCRIPTION:  After full informed written consent was obtained from the patient, the patient was brought back to the operating room, and placed supine upon the operating table.  Prior to induction, the patient received IV antibiotics.  The patient was then prepped and draped in the standard fashion for a below-the-knee amputation.  I placed a non-sterile tourniquet on the thigh prior to the procedure.  After obtaining adequate anesthesia, the patient was prepped and draped in the standard fashion for a right below-the-knee amputation.  I marked out the anterior incision 10 cm distal to the tibial tuberosity and then the marked out a posterior flap that was one third of the circumference  of the calf in length.  I then exsanguinated the leg with a Esmarch bandage and then inflated the tourniquet to 250 mm Hg.   I made the incisions for these flaps, and then dissected through the subcutaneous tissue, fascia, and muscle anteriorly.  I elevated  the periosteal tissue superiorly so that the tibia was about  3 cm shorter than the anterior skin flap.  I then transected the tibia with a power saw and then took a wedge off the tibia anteriorly with the power saw.  Then I smoothed out the rough edges with a rasp.  In a similar fashion, I cut back the fibula about two centimeters higher than the level of the tibia with a bone cutter.  I put a bone hook into the distal tibia and then used a Lister knife to sharply develop a tissue plane through the muscle along the fibula.  In such fashion, the posterior flap was developed.  At this point, the specimen was passed off the field as the below-the-knee amputation.  At this point, I clamped all visibly bleeding arteries and veins using a combination of suture ligation with Vicryl suture and electrocautery.  The tourniquet was then deflated at this point.  Bleeding continued to be controlled with electrocautery and suture ligature.  The stump was washed off with sterile normal saline and no further active bleeding was noted.  I reapproximated the anterior and posterior fascia  with interrupted stitches of 2-0 Vicryl.  This was completed along the entire length of anterior and posterior fascia until there were no more loose space in the fascial line.  The skin was then  reapproximated with staples.  The stump was washed off and dried.  The incision was dressed with Adpatec and  then fluffs were applied.  Kerlix was wrapped around the leg and then gently an ACE wrap was applied.    COMPLICATIONS: none  CONDITION: stable   Leonides Sake, MD [facility] [place] Office: [phone] Pager: [phone]  05/03/2013, 11:22 AM

## 2013-05-03 NOTE — Preoperative (Signed)
Beta Blockers   Reason not to administer Beta Blockers:Not Applicable 

## 2013-05-03 NOTE — H&P (Signed)
[facility] [place]  Brief History and Physical  History of Present Illness  Lindsay Bentley is a 79 y.o. female who presents with chief complaint: progressive right leg pain and foot gangrene.  This pt has no revascularization options.  The family and patient had previously elected palliative management but the patient notes increased pain and smell, so they are ready to proceed with a R BKA.    Past Medical History  Diagnosis Date  . Hypertension   . Gangrene of foot   . Peripheral vascular disease   . Hyperglycemia     has Blood sugar checked twice a day    Past Surgical History  Procedure Laterality Date  . Total hip arthroplasty Right   . Mastectomy Bilateral   . Joint replacement      History   Social History  . Marital Status: Widowed    Spouse Name: N/A    Number of Children: N/A  . Years of Education: N/A   Occupational History  . Not on file.   Social History Main Topics  . Smoking status: Former Smoker -- 0.50 packs/day for 50 years    Types: Cigarettes    Quit date: 04/08/2013  . Smokeless tobacco: Never Used  . Alcohol Use: No  . Drug Use: No  . Sexual Activity: No   Other Topics Concern  . Not on file   Social History Narrative  . No narrative on file    History reviewed. No pertinent family history.  No current facility-administered medications on file prior to encounter.   Current Outpatient Prescriptions on File Prior to Encounter  Medication Sig Dispense Refill  . aspirin 81 MG tablet Take 81 mg by mouth daily.      Marland Kitchen atenolol (TENORMIN) 25 MG tablet Take 25 mg by mouth daily.      Marland Kitchen HYDROcodone-acetaminophen (NORCO/VICODIN) 5-325 MG per tablet Take 1 tablet by mouth every 4 (four) hours as needed.  30 tablet  0  . nicotine (NICODERM CQ - DOSED IN MG/24 HOURS) 14 mg/24hr patch Place 1 patch onto the skin daily.  28 patch  3  . nitroGLYCERIN (NITROSTAT) 0.4 MG SL tablet Place 0.4 mg under the tongue every 5 (five)  minutes as needed for chest pain.      . pravastatin (PRAVACHOL) 10 MG tablet Take 10 mg by mouth daily.      Marland Kitchen senna-docusate (SENOKOT-S) 8.6-50 MG per tablet Take 2 tablets by mouth 2 (two) times daily.  60 tablet  3  . sorbitol 70 % SOLN Take 50 mLs by mouth 2 (two) times daily at 10 am and 4 pm.  4000 mL  3    No Known Allergies  Review of Systems: As listed above, otherwise negative.  Physical Examination  Filed Vitals:   05/03/13 0741  BP: 142/54  Pulse: 64  Temp: 98.2 F (36.8 C)  TempSrc: Oral  Resp: 18  SpO2: 100%    General: A&O x 3, WDWN  Pulmonary: Sym exp, good air movt, CTAB, no rales, rhonchi, & wheezing  Cardiac: RRR, Nl S1, S2, no Murmurs, rubs or gallops  Gastrointestinal: soft, NTND, -G/R, - HSM, - masses, - CVAT B  Musculoskeletal: generalized weakness through, R foot bandaged with severe black gangrenous toe tips  Laboratory See iStat  Medical Decision Making  Lindsay Bentley is a 79 y.o. female who presents with: right foot gangrene.   The patient is scheduled for: right below-knee amputation I discussed in depth the  nature of right below-the-knee amputation with the patient, including risks, benefits, and alternatives.   The patient is aware that the risks of right below-the-knee amputation include but are not limited to: bleeding, infection, myocardial infarction, stroke, death, failure to heal amputation wound, and possible need for more proximal amputation.   The patient is aware of the risks and agrees proceed forward with the procedure.  The patient is aware of the risks and agrees to proceed.  Leonides Sake, MD [facility] [place] Office: [phone] Pager: [phone]  05/03/2013, 7:35 AM

## 2013-05-03 NOTE — Telephone Encounter (Addendum)
Message copied by Fredrich Birks on Mon May 03, 2013  4:04 PM ------      Message from: Marlowe Shores      Created: Mon May 03, 2013 11:40 AM       4 week Right BKA F/U - staples - Chen ------  05/03/13: pts phone constantly busy- mailed letter, dpm

## 2013-05-03 NOTE — Anesthesia Postprocedure Evaluation (Signed)
  Anesthesia Post-op Note  Patient: Lindsay Bentley  Procedure(s) Performed: Procedure(s): AMPUTATION BELOW KNEE - RIGHT (Right)  Patient Location: PACU  Anesthesia Type:General  Level of Consciousness: awake, alert  and oriented  Airway and Oxygen Therapy: Patient Spontanous Breathing and Patient connected to nasal cannula oxygen  Post-op Pain: mild  Post-op Assessment: Post-op Vital signs reviewed, Respiratory Function Stable, Patent Airway and Pain level controlled  Post-op Vital Signs: stable  Complications: No apparent anesthesia complications

## 2013-05-04 ENCOUNTER — Encounter (HOSPITAL_COMMUNITY): Payer: Self-pay | Admitting: Vascular Surgery

## 2013-05-04 LAB — BASIC METABOLIC PANEL
BUN: 14 mg/dL (ref 6–23)
CO2: 26 mEq/L (ref 19–32)
Chloride: 101 mEq/L (ref 96–112)
Creatinine, Ser: 0.95 mg/dL (ref 0.50–1.10)
Glucose, Bld: 119 mg/dL — ABNORMAL HIGH (ref 70–99)
Potassium: 4.4 mEq/L (ref 3.5–5.1)

## 2013-05-04 LAB — CBC
HCT: 26.9 % — ABNORMAL LOW (ref 36.0–46.0)
Hemoglobin: 8.7 g/dL — ABNORMAL LOW (ref 12.0–15.0)
MCH: 29.5 pg (ref 26.0–34.0)
MCHC: 32.3 g/dL (ref 30.0–36.0)
MCV: 91.2 fL (ref 78.0–100.0)
RBC: 2.95 MIL/uL — ABNORMAL LOW (ref 3.87–5.11)
RDW: 13 % (ref 11.5–15.5)

## 2013-05-04 NOTE — Evaluation (Addendum)
Occupational Therapy Evaluation Patient Details Name: Lindsay Bentley MRN: [id number] DOB: 22-Dec-1927 Today's Date: 05/04/2013 Time: 4098-1191 OT Time Calculation (min): 30 min  OT Assessment / Plan / Recommendation History of present illness Lindsay Bentley is a 79 y.o. female who presents with chief complaint: progressive right leg pain and foot gangrene.  This pt has no revascularization options.  The family and patient had previously elected palliative management but the patient notes increased pain and smell, so they are ready to proceed with a R BKA.  s/p Right BKA 05/03/13.    Clinical Impression   Pt admitted for above.  She presents to OT with the below listed deficits and will benefit from continued OT to maximize her independence with BADLs and functional mobility.  She is very motivated and should progress well.  Recommend SNF.    OT Assessment  Patient needs continued OT Services    Follow Up Recommendations  SNF;Supervision/Assistance - 24 hour    Barriers to Discharge Decreased caregiver support    Equipment Recommendations  None recommended by OT    Recommendations for Other Services    Frequency  Min 2X/week    Precautions / Restrictions Precautions Precautions: Fall   Pertinent Vitals/Pain     ADL  Eating/Feeding: Independent Where Assessed - Eating/Feeding: Chair Grooming: Wash/dry hands;Wash/dry face;Teeth care;Set up Where Assessed - Grooming: Supported sitting Upper Body Bathing: Set up Where Assessed - Upper Body Bathing: Supported sitting Lower Body Bathing: Maximal assistance Where Assessed - Lower Body Bathing: Supported sit to stand Upper Body Dressing: Set up Where Assessed - Upper Body Dressing: Supported sitting;Unsupported sitting Lower Body Dressing: Maximal assistance Where Assessed - Lower Body Dressing: Supported sit to Pharmacist, hospital: +2 Total assistance Toilet Transfer: Patient Percentage: 50% Statistician Method: Lawyer: Materials engineer and Hygiene: +1 Total assistance Where Assessed - Glass blower/designer Manipulation and Hygiene: Standing Transfers/Ambulation Related to ADLs: Total A +2 (pt ~60%)    OT Diagnosis: Generalized weakness;Acute pain  OT Problem List: Decreased strength;Decreased activity tolerance;Impaired balance (sitting and/or standing);Decreased knowledge of use of DME or AE;Pain OT Treatment Interventions: Self-care/ADL training;Therapeutic exercise;DME and/or AE instruction;Therapeutic activities;Patient/family education;Balance training   OT Goals(Current goals can be found in the care plan section) Acute Rehab OT Goals Patient Stated Goal: To get stronger OT Goal Formulation: With patient Time For Goal Achievement: 04/19/13 Potential to Achieve Goals: Good ADL Goals Pt Will Perform Lower Body Bathing: with mod assist;sit to/from stand Pt Will Perform Lower Body Dressing: with mod assist;sit to/from stand Pt Will Transfer to Toilet: with min assist;squat pivot transfer;bedside commode Pt Will Perform Toileting - Clothing Manipulation and hygiene: with mod assist;sit to/from stand Pt/caregiver will Perform Home Exercise Program: Increased strength;Both right and left upper extremity;With theraband;Independently  Visit Information  Last OT Received On: 05/04/13 Assistance Needed: +2 History of Present Illness: Lindsay Bentley is a 79 y.o. female who presents with chief complaint: progressive right leg pain and foot gangrene.  This pt has no revascularization options.  The family and patient had previously elected palliative management but the patient notes increased pain and smell, so they are ready to proceed with a R BKA.  s/p Right BKA 05/03/13.        Prior Functioning     Home Living Family/patient expects to be discharged to:: Skilled nursing facility Living Arrangements: Other relatives Available Help at  Discharge: Family;Available PRN/intermittently Type of Home: House Home Access: Ramped entrance Home Layout: One  level Home Equipment: Walker - 2 wheels;Bedside commode;Cane - single point Prior Function Level of Independence: Independent with assistive device(s);Needs assistance Gait / Transfers Assistance Needed: Uses cane.  Only ambulates without assist in house.  If goes outside, has min assist of someone holding her arm for balance/safety. ADL's / Homemaking Assistance Needed: Assist with meal prep and housekeeping.  Patient takes sponge baths independently. Communication Communication: No difficulties Dominant Hand: Right         Vision/Perception     Cognition  Cognition Arousal/Alertness: Awake/alert Behavior During Therapy: WFL for tasks assessed/performed Overall Cognitive Status: Within Functional Limits for tasks assessed    Extremity/Trunk Assessment Upper Extremity Assessment Upper Extremity Assessment: Overall WFL for tasks assessed Lower Extremity Assessment Lower Extremity Assessment: Defer to PT evaluation     Mobility Bed Mobility Bed Mobility: Not assessed Transfers Transfers: Sit to Stand;Stand to Sit Sit to Stand: 3: Mod assist;With upper extremity assist;From chair/3-in-1 Stand to Sit: 3: Mod assist;With upper extremity assist;To chair/3-in-1 Details for Transfer Assistance: Assist to move into standing position.  She required +2 total A (pt ~50%).  Pt required mod A for hand placement, and demonstrated difficulty pivoting to Lt.- she reports her Rt LE was her stronger LE and she keeps forgetting to move the Lt. LE.  When transferring to Rt, pt required Total A +2 (pt ~70%)     Exercise     Balance     End of Session OT - End of Session Activity Tolerance: Patient tolerated treatment well Patient left: in chair;with call bell/phone within reach;with family/visitor present Nurse Communication: Mobility status  GO     Lindsay Bentley, [name]  M 05/04/2013, 7:20 PM

## 2013-05-04 NOTE — Care Management Note (Signed)
    Page 1 of 1   05/05/2013     4:55:38 PM   CARE MANAGEMENT NOTE 05/05/2013  Patient:  Lindsay Bentley, Lindsay Bentley   Account Number:  [id number]  Date Initiated:  05/04/2013  Documentation initiated by:  Letitia Sabala  Subjective/Objective Assessment:   PT S/P RT BKA ON 05/03/13.  PTA, PT RESIDES AT [facility] SKILL[facility]URSING FACILITY.     Action/Plan:   PT TO RETURN TO SNF AT DC.  WILL CONSULT CSW TO FACILITATE RETURN TO SNF WHEN MEDICALLY STABLE FOR DISCHARGE.  WILL FOLLOW PROGRESS.   Anticipated DC Date:  05/05/2013   Anticipated DC Plan:  SKILLED NURSING FACILITY  In-house referral  Clinical Social Worker      DC Planning Services  CM consult      Choice offered to / List presented to:             Status of service:  Completed, signed off Medicare Important Message given?   (If response is "NO", the following Medicare IM given date fields will be blank) Date Medicare IM given:   Date Additional Medicare IM given:    Discharge Disposition:  SKILLED NURSING FACILITY  Per UR Regulation:  Reviewed for med. necessity/level of care/duration of stay  If discussed at Long Length of Stay Meetings, dates discussed:    Comments:  05/05/13 Shukri Nistler,RN,BSN [phone] PT DISCHARGING BACK TO [facility] TODAY, PER CSW ARRANGEMENTS.

## 2013-05-04 NOTE — Progress Notes (Signed)
Clinical Social Work Department BRIEF PSYCHOSOCIAL ASSESSMENT 05/04/2013  Patient:  Lindsay Bentley, Lindsay Bentley     Account Number:  [id number]     Admit date:  05/03/2013  Clinical Social Worker:  Carren Rang  Date/Time:  05/04/2013 02:49 PM  Referred by:  Physician  Date Referred:  05/04/2013 Referred for  SNF Placement   Other Referral:   Interview type:  Patient Other interview type:    PSYCHOSOCIAL DATA Living Status:  FACILITY Admitted from facility:  [facility] Level of care:  Skilled Nursing Facility Primary support name:  Yolonda Kida Primary support relationship to patient:  CHILD, ADULT Degree of support available:   Good    CURRENT CONCERNS Current Concerns  Post-Acute Placement   Other Concerns:    SOCIAL WORK ASSESSMENT / PLAN CSW received referral that patient is from [facility]. CSW went into room and introduced self and explained reason for visit. Patient  stated that the plan is for patient to return back to [facility]. CSW asked patient if she could contact her daughter and patient stated no, that she will speak to her daughter. CSW will confirm patient's return at SNF. CSW completed FL2 and put on the chart for MD signature.   Assessment/plan status:  Psychosocial Support/Ongoing Assessment of Needs Other assessment/ plan:   Information/referral to community resources:   SNF/CSW info    PATIENT'S/FAMILY'S RESPONSE TO PLAN OF CARE: Patient reports she plans on returning back to [facility] Rehab.       Maree Krabbe, MSW, Theresia Majors [phone]

## 2013-05-04 NOTE — Evaluation (Signed)
Physical Therapy Evaluation Patient Details Name: Lindsay Bentley MRN: [id number] DOB: 27-Nov-1927 Today's Date: 05/04/2013 Time: 1478-2956 PT Time Calculation (min): 32 min  PT Assessment / Plan / Recommendation History of Present Illness  Lindsay Bentley is a 79 y.o. female who presents with chief complaint: progressive right leg pain and foot gangrene.  This pt has no revascularization options.  The family and patient had previously elected palliative management but the patient notes increased pain and smell, so they are ready to proceed with a R BKA.  s/p Right BKA 05/03/13.   Clinical Impression  Patient is s/p right BKA surgery resulting in functional limitations due to the deficits listed below (see PT Problem List). Pt very motivated and willing to work with therapy.  Educated pt on phantom pain as well as HEP.  Plan to begin with lateral transfers to recliner prior to ambulation due to pt limited from left hip pain. Patient will benefit from skilled PT to increase their independence and safety with mobility to allow discharge to the venue listed below.      PT Assessment  Patient needs continued PT services    Follow Up Recommendations  SNF    Barriers to Discharge Decreased caregiver support      Equipment Recommendations  None recommended by PT    Recommendations for Other Services     Frequency Min 3X/week    Precautions / Restrictions Precautions Precautions: Fall Restrictions Weight Bearing Restrictions: No   Pertinent Vitals/Pain C/o right LE pain with mobility but does not rate      Mobility  Bed Mobility Bed Mobility: Supine to Sit;Sitting - Scoot to Edge of Bed Supine to Sit: 4: Min guard;With rails;HOB elevated Sitting - Scoot to Edge of Bed: 4: Min assist;With rail Details for Bed Mobility Assistance: (A) to advance hips to EOB.  Transfers Transfers: Sit to Stand;Stand to Sit;Stand Pivot Transfers Sit to Stand: 3: Mod assist;From bed Stand to Sit: 3: Mod  assist;To chair/3-in-1 Stand Pivot Transfers: 3: Mod assist;From elevated surface Details for Transfer Assistance: (A) to initaite transfer and maintain balance with max cues for hand and LE placement.  (A) to advance hips to recliner.  Ambulation/Gait Ambulation/Gait Assistance: Not tested (comment)    Exercises Total Joint Exercises Short Arc Quad: Strengthening;Both;10 reps Amputee Exercises Quad Sets: Strengthening;Both;10 reps Hip ABduction/ADduction: Strengthening;Both;10 reps   PT Diagnosis: Difficulty walking;Generalized weakness;Acute pain  PT Problem List: Decreased strength;Decreased range of motion;Decreased activity tolerance;Decreased balance;Decreased mobility;Decreased knowledge of use of DME;Pain PT Treatment Interventions: DME instruction;Gait training;Functional mobility training;Balance training;Patient/family education     PT Goals(Current goals can be found in the care plan section) Acute Rehab PT Goals Patient Stated Goal: To be able to return home PT Goal Formulation: With patient/family Time For Goal Achievement: 05/18/13 Potential to Achieve Goals: Good  Visit Information  Last PT Received On: 05/04/13 History of Present Illness: Lindsay Bentley is a 79 y.o. female who presents with chief complaint: progressive right leg pain and foot gangrene.  This pt has no revascularization options.  The family and patient had previously elected palliative management but the patient notes increased pain and smell, so they are ready to proceed with a R BKA.  s/p Right BKA 05/03/13.        Prior Functioning  Home Living Family/patient expects to be discharged to:: Skilled nursing facility Prior Function Level of Independence: Independent with assistive device(s);Needs assistance    Cognition  Cognition Arousal/Alertness: Awake/alert Behavior During Therapy: WFL for tasks assessed/performed Overall  Cognitive Status: Within Functional Limits for tasks assessed     Extremity/Trunk Assessment Lower Extremity Assessment Lower Extremity Assessment: RLE deficits/detail RLE Deficits / Details: s/p Right BKA, quad gross 3/5 with functional activity LLE Deficits / Details: Pt with severe left hip crepitus with mobility and unable to fully assess strength.   Balance Balance Balance Assessed: Yes Static Sitting Balance Static Sitting - Balance Support: Feet unsupported Static Sitting - Level of Assistance: 5: Stand by assistance  End of Session PT - End of Session Equipment Utilized During Treatment: Gait belt Activity Tolerance: Patient tolerated treatment well Patient left: in chair;with call bell/phone within reach Nurse Communication: Mobility status  GP     Lindsay Bentley 05/04/2013, 9:46 AM  Jake Shark, PT DPT [phone]

## 2013-05-04 NOTE — Progress Notes (Addendum)
[facility] Progress Note  05/04/2013 7:32 AM POD 1  Subjective:  No complaints this am.  Pain is well controlled  Afebrile HR  50's-90's  99% 2LO2NC  Filed Vitals:   05/04/13 0454  BP: 119/51  Pulse: 61  Temp: 98.5 F (36.9 C)  Resp: 17    Physical Exam: Incisions:  Covered with bandage-bandage is clean and in tact   CBC    Component Value Date/Time   WBC 6.3 05/04/2013 0334   RBC 2.95* 05/04/2013 0334   HGB 8.7* 05/04/2013 0334   HCT 26.9* 05/04/2013 0334   PLT 184 05/04/2013 0334   MCV 91.2 05/04/2013 0334   MCH 29.5 05/04/2013 0334   MCHC 32.3 05/04/2013 0334   RDW 13.0 05/04/2013 0334   LYMPHSABS 1.1 04/02/2013 1250   MONOABS 0.6 04/02/2013 1250   EOSABS 0.2 04/02/2013 1250   BASOSABS 0.0 04/02/2013 1250    BMET    Component Value Date/Time   NA 136 05/04/2013 0334   K 4.4 05/04/2013 0334   CL 101 05/04/2013 0334   CO2 26 05/04/2013 0334   GLUCOSE 119* 05/04/2013 0334   BUN 14 05/04/2013 0334   CREATININE 0.95 05/04/2013 0334   CALCIUM 9.1 05/04/2013 0334   GFRNONAA 54* 05/04/2013 0334   GFRAA 63* 05/04/2013 0334    INR    Component Value Date/Time   INR 1.0 12/16/2008 1153     Intake/Output Summary (Last 24 hours) at 05/04/13 0732 Last data filed at 05/04/13 0146  Gross per 24 hour  Intake    700 ml  Output    350 ml  Net    350 ml     Assessment/Plan:  79 y.o. female is s/p right below knee amputation  POD 1  -pt doing well this am. -pain is well controlled. -acute surgical blood loss anemia-pt tolerating -will remove dressing tomorrow  -PT today   Doreatha Massed, PA-C [facility] [phone] 05/04/2013 7:32 AM  Addendum  I have independently interviewed and examined the patient, and I agree with the physician assistant's findings.  PT/OT/PMR evaluation.  Pt might be reasonable candidate for CIR given preop ambulatory status with walker.  Leonides Sake, MD [facility]  of [place] Office: [phone] Pager: [phone]  05/04/2013, 7:58 AM

## 2013-05-05 LAB — CBC
HCT: 26.9 % — ABNORMAL LOW (ref 36.0–46.0)
Hemoglobin: 8.8 g/dL — ABNORMAL LOW (ref 12.0–15.0)
MCV: 91.2 fL (ref 78.0–100.0)
Platelets: 196 10*3/uL (ref 150–400)
RBC: 2.95 MIL/uL — ABNORMAL LOW (ref 3.87–5.11)
WBC: 6.7 10*3/uL (ref 4.0–10.5)

## 2013-05-05 LAB — BASIC METABOLIC PANEL
BUN: 10 mg/dL (ref 6–23)
Creatinine, Ser: 0.9 mg/dL (ref 0.50–1.10)
GFR calc Af Amer: 67 mL/min — ABNORMAL LOW (ref >90)
Glucose, Bld: 115 mg/dL — ABNORMAL HIGH (ref 70–99)

## 2013-05-05 MED ORDER — OXYCODONE HCL 5 MG PO TABS: 5.0000 mg | ORAL_TABLET | Freq: Four times a day (QID) | ORAL | Status: DC | PRN

## 2013-05-05 NOTE — Progress Notes (Signed)
[facility] of [place]  Daily Progress Note  Assessment/Planning: POD #2 s/p R BKA   Viable R BKA stump, dress with ABD bdg and stumps sock  I think this pt would be best served in CIR given likely prosthetic candidate in near future  Awaiting CIR opinion  Other medical issues: 1.  Diabetes with peripheral arterial complications: not on any agents currently, diet control 2.  Critical limb ischemia with gangrene: s/p R BKA, L leg still with peroneal runoff 3.  Hyperlipidemia: on Zocor, LDL >70 so not quite at target 4.  Hypertension: stable on current regimen  Subjective  - 2 Days Post-Op  Minimal pain  Objective Filed Vitals:   05/04/13 0454 05/04/13 1337 05/04/13 2023 05/05/13 0441  BP: 119/51 114/52 124/53 135/63  Pulse: 61 114 66 69  Temp: 98.5 F (36.9 C) 98.6 F (37 C) 100.1 F (37.8 C) 99.8 F (37.7 C)  TempSrc: Oral Oral Oral Oral  Resp: 17 18 18 18   Height:      Weight:      SpO2: 99% 99% 96% 92%    Intake/Output Summary (Last 24 hours) at 05/05/13 0823 Last data filed at 05/05/13 0442  Gross per 24 hour  Intake    240 ml  Output   1600 ml  Net  -1360 ml    PULM  CTAB CV  RRR GI  soft, NTND VASC  R BKA inc c/d/i, staples in place, viable stump  Laboratory CBC    Component Value Date/Time   WBC 6.7 05/05/2013 0413   HGB 8.8* 05/05/2013 0413   HCT 26.9* 05/05/2013 0413   PLT 196 05/05/2013 0413    BMET    Component Value Date/Time   NA 135 05/05/2013 0413   K 3.7 05/05/2013 0413   CL 101 05/05/2013 0413   CO2 25 05/05/2013 0413   GLUCOSE 115* 05/05/2013 0413   BUN 10 05/05/2013 0413   CREATININE 0.90 05/05/2013 0413   CALCIUM 9.7 05/05/2013 0413   GFRNONAA 58* 05/05/2013 0413   GFRAA 67* 05/05/2013 0413    Leonides Sake, MD [facility] [place] Office: [phone] Pager: [phone]  05/05/2013, 8:23 AM

## 2013-05-05 NOTE — Progress Notes (Signed)
Clinical Social Worker received phone call from [facility] admissions coordinator, who stated that they have insurance authorization and to go ahead and send patient to SNF. CSW facilitated patient discharge by contacting the patient and facility, [facility]. Patient agreeable to this plan and arranging transport via EMS . CSW will sign off, as social work intervention is no longer needed.  Maree Krabbe, MSW, Theresia Majors [phone]

## 2013-05-05 NOTE — Progress Notes (Addendum)
Thank you for consult on Lindsay Bentley. She will likely need prolonged therapies to get to independent level. Note that patient plans on returning back to [facility]. Would recommend follow up therapies at SNF level. Please reconsult if disposition plan changes.

## 2013-05-05 NOTE — Discharge Summary (Signed)
[facility] Discharge Summary  Lindsay Bentley December 13, 1927 79 y.o. female  [id number]  Admission Date: 05/03/2013  Discharge Date: 05/05/13  Physician: Fransisco Hertz, MD  Admission Diagnosis: Peripheral Vascular Disease with Gangrene of Right Foot  Secondary Diagnoses: Past Medical History  Diagnosis Date  . Hypertension   . Gangrene of foot   . Peripheral vascular disease   . Hyperglycemia     has Blood sugar checked twice a day  . Hyperlipemia   . Constipation     HPI:   This is a 79 y.o. female who presents with chief complaint: progressive right leg pain and foot gangrene. This pt has no revascularization options. The family and patient had previously elected palliative management but the patient notes increased pain and smell, so they are ready to proceed with a R BKA.   Hospital Course:  The patient was admitted to the hospital and taken to the operating room on 05/03/2013 and underwent: Right below knee amputation.    The pt tolerated the procedure well and was transported to the PACU in good condition. By POD 1, she was doing well and pain was well controlled.  By POD 2, her dressing was removed and wounds healing nicely.  She did have acute surgical blood loss anemia and tolerating well.  PT and OT evaluated this patient and did not feel she was a good candidate for inpatient rehab.  The remainder of the hospital course consisted of increasing mobilization and increasing intake of solids without difficulty.  CBC    Component Value Date/Time   WBC 6.7 05/05/2013 0413   RBC 2.95* 05/05/2013 0413   HGB 8.8* 05/05/2013 0413   HCT 26.9* 05/05/2013 0413   PLT 196 05/05/2013 0413   MCV 91.2 05/05/2013 0413   MCH 29.8 05/05/2013 0413   MCHC 32.7 05/05/2013 0413   RDW 13.3 05/05/2013 0413   LYMPHSABS 1.1 04/02/2013 1250   MONOABS 0.6 04/02/2013 1250   EOSABS 0.2 04/02/2013 1250   BASOSABS 0.0 04/02/2013 1250    BMET    Component Value Date/Time   NA  135 05/05/2013 0413   K 3.7 05/05/2013 0413   CL 101 05/05/2013 0413   CO2 25 05/05/2013 0413   GLUCOSE 115* 05/05/2013 0413   BUN 10 05/05/2013 0413   CREATININE 0.90 05/05/2013 0413   CALCIUM 9.7 05/05/2013 0413   GFRNONAA 58* 05/05/2013 0413   GFRAA 67* 05/05/2013 0413     Discharge Instructions:   The patient is discharged to home with extensive instructions on wound care and progressive ambulation.  They are instructed not to drive or perform any heavy lifting until returning to see the physician in his office.  Discharge Orders   Future Appointments Provider Department Dept Phone   05/28/2013 9:45 AM Fransisco Hertz, MD [facility] [phone]   Future Orders Complete By Expires   Call MD for:  redness, tenderness, or signs of infection (pain, swelling, bleeding, redness, odor or green/yellow discharge around incision site)  As directed    Call MD for:  severe or increased pain, loss or decreased feeling  in affected limb(s)  As directed    Call MD for:  temperature >100.5  As directed    Discharge wound care:  As directed    Comments:     Shower daily with soap and water starting 05/07/13   Resume previous diet  As directed       Discharge Diagnosis:  Peripheral Vascular Disease  with Gangrene of Right Foot  Secondary Diagnosis: Patient Active Problem List   Diagnosis Date Noted  . Dry gangrene 04/02/2013  . Constipation 04/02/2013  . Tobacco abuse 04/02/2013   Past Medical History  Diagnosis Date  . Hypertension   . Gangrene of foot   . Peripheral vascular disease   . Hyperglycemia     has Blood sugar checked twice a day  . Hyperlipemia   . Constipation        Medication List         aspirin 81 MG tablet  Take 81 mg by mouth daily.     atenolol 25 MG tablet  Commonly known as:  TENORMIN  Take 25 mg by mouth daily.     HYDROcodone-acetaminophen 5-325 MG per tablet  Commonly known as:  NORCO/VICODIN  Take 1 tablet by  mouth every 4 (four) hours as needed.     nicotine 14 mg/24hr patch  Commonly known as:  NICODERM CQ - dosed in mg/24 hours  Place 1 patch onto the skin daily.     nitroGLYCERIN 0.4 MG SL tablet  Commonly known as:  NITROSTAT  Place 0.4 mg under the tongue every 5 (five) minutes as needed for chest pain.     oxyCODONE 5 MG immediate release tablet  Commonly known as:  ROXICODONE  Take 1 tablet (5 mg total) by mouth every 6 (six) hours as needed for pain.     pravastatin 10 MG tablet  Commonly known as:  PRAVACHOL  Take 10 mg by mouth daily.     senna-docusate 8.6-50 MG per tablet  Commonly known as:  Senokot-S  Take 2 tablets by mouth 2 (two) times daily.     sennosides-docusate sodium 8.6-50 MG tablet  Commonly known as:  SENOKOT-S  Take 1 tablet by mouth 2 (two) times daily.     sorbitol 70 % Soln  Take 50 mLs by mouth 2 (two) times daily at 10 am and 4 pm.        Roxicodone #30 No Refill  Disposition: SNF  Patient's condition: is Good  Follow up: 1. Dr. Imogene Burn in 4 weeks   Doreatha Massed, PA-C [facility] [phone] 05/05/2013  8:35 AM  Addendum  I have independently interviewed and examined the patient, and I agree with the physician assistant's findings.  This elderly patient with significant tibial artery disease likely due to her diabetes and hypertension previously underwent diagnostic angiography.  She did not have a good endovascular intervention in the right leg.  Her dry gangrene in that leg progressed and she continued to have rest pain.  Finally, she requested palliative right below-knee amputation to help with her pain.  The patient previously ambulated with the aid of a walker.  I felt she should be considered for inpatient rehab as I think she will be a prosthesis candidate.  By report, her insurance and family situation is not compatible with such, however, so she will need to continue her rehab in a SNF.  The patient will follow  up in 4 weeks to re-check the right below-knee amputation stump and remove the staples at that time.  Leonides Sake, MD [facility] [place] Office: [phone] Pager: [phone]  05/05/2013, 10:00 AM

## 2013-05-05 NOTE — Progress Notes (Signed)
CSW sent over clinicals to [facility] for SNF authorization. CSW awaiting SNF authorization, anticipate today.  Maree Krabbe, MSW, Theres[facility] Majors [phone]

## 2013-05-05 NOTE — Progress Notes (Signed)
[facility] to give report. Discharge packet to be given to EMS along with prescriptions. Discharged tele and IV. Called family to let them know of discharge. No further questions at this time.  BARNETT, Geroge Baseman

## 2013-05-27 ENCOUNTER — Encounter: Payer: Self-pay | Admitting: Vascular Surgery

## 2013-05-28 ENCOUNTER — Encounter: Payer: Self-pay | Admitting: Vascular Surgery

## 2013-05-28 ENCOUNTER — Encounter (INDEPENDENT_AMBULATORY_CARE_PROVIDER_SITE_OTHER): Payer: Self-pay

## 2013-05-28 ENCOUNTER — Ambulatory Visit (INDEPENDENT_AMBULATORY_CARE_PROVIDER_SITE_OTHER): Payer: Self-pay | Admitting: Vascular Surgery

## 2013-05-28 VITALS — BP 144/71 | HR 66 | Ht 63.0 in | Wt 146.0 lb

## 2013-05-28 DIAGNOSIS — I739 Peripheral vascular disease, unspecified: Secondary | ICD-10-CM

## 2013-05-28 DIAGNOSIS — I70219 Atherosclerosis of native arteries of extremities with intermittent claudication, unspecified extremity: Secondary | ICD-10-CM | POA: Insufficient documentation

## 2013-05-28 DIAGNOSIS — I96 Gangrene, not elsewhere classified: Secondary | ICD-10-CM

## 2013-05-28 NOTE — Progress Notes (Signed)
[facility] [place]  Postoperative Visit  History of Present Illness  Lindsay Bentley is a 79 y.o. female who presents for postoperative follow-up for: R below-the-knee amputation (Date: 05/03/13).  The patient's wounds are healed.  The patient notes pain is well controlled.  The patient's current symptoms are: none.  For VQI Use Only  PRE-ADM LIVING: Home  AMB STATUS: Wheelchair  Physical Examination  Filed Vitals:   05/28/13 0958  BP: 144/71  Pulse: 66   RLE: Incision is healed.  Staples are intact.  Medical Decision Making  Lindsay Bentley is a 79 y.o. female who presents s/p R below-the-knee ampuation.  The patient's stump is healing appropriately with resolution of pre-operative symptoms. I discussed in depth with the patient the nature of atherosclerosis, and emphasized the importance of maximal medical management including strict control of blood pressure, blood glucose, and lipid levels, obtaining regular exercise, and cessation of smoking.  The patient is aware that without maximal medical management the underlying atherosclerotic disease process will progress, possibly leading to a more proximal amputation. The patient agrees to participate in their maximal medical care.  Thank you for allowing [place] to participate in this patient's care.  The patient has been referred for prosthetic fitting.  The patient can follow up with [place] as needed.  Leonides Sake, MD [facility] [place] Office: [phone] Pager: [phone]  05/28/2013, 10:12 AM

## 2013-11-18 ENCOUNTER — Other Ambulatory Visit: Payer: Self-pay | Admitting: *Deleted

## 2013-11-18 DIAGNOSIS — I70229 Atherosclerosis of native arteries of extremities with rest pain, unspecified extremity: Secondary | ICD-10-CM

## 2013-11-25 ENCOUNTER — Encounter: Payer: Self-pay | Admitting: Vascular Surgery

## 2013-11-26 ENCOUNTER — Encounter: Payer: Self-pay | Admitting: Vascular Surgery

## 2013-11-26 ENCOUNTER — Ambulatory Visit (HOSPITAL_COMMUNITY)
Admission: RE | Admit: 2013-11-26 | Discharge: 2013-11-26 | Disposition: A | Discharge status: Home / Self Care | Payer: Medicare PPO | Source: Ambulatory Visit | Attending: Vascular Surgery | Admitting: Vascular Surgery

## 2013-11-26 ENCOUNTER — Ambulatory Visit (INDEPENDENT_AMBULATORY_CARE_PROVIDER_SITE_OTHER): Payer: Medicare PPO | Admitting: Vascular Surgery

## 2013-11-26 VITALS — BP 170/84 | HR 68 | Resp 16 | Ht 62.0 in | Wt 153.0 lb

## 2013-11-26 DIAGNOSIS — I70229 Atherosclerosis of native arteries of extremities with rest pain, unspecified extremity: Secondary | ICD-10-CM

## 2013-11-26 DIAGNOSIS — I872 Venous insufficiency (chronic) (peripheral): Secondary | ICD-10-CM

## 2013-11-26 DIAGNOSIS — I70219 Atherosclerosis of native arteries of extremities with intermittent claudication, unspecified extremity: Secondary | ICD-10-CM

## 2013-11-26 NOTE — Progress Notes (Signed)
Referred by:  Lillard Anes, MD [address] [place] [address] [place], [place] [address]  Reason for referral: left leg pain concerning for ischemia  History of Present Illness  Lindsay Bentley is a 80 y.o. (1927-11-06) female s/p R BKA who presents with chief complaint: left leg pain.  Onset of symptom occurred years ago, over the last few months patient notes some increased foot pain with ambulation and intermittently at rest.  Pt's ambulation is limited, using a walker with her R BKA prosthesis.  Pain is described as aching, severity 3-6/10, and associated with color change in left foot.  Patient has attempted to treat this pain with rest.  Atherosclerotic risk factors include: HTN, hyperglycemia, hyperlipidemia, and prior smoker.  Past Medical History  Diagnosis Date  . Hypertension   . Gangrene of foot   . Peripheral vascular disease   . Hyperglycemia     has Blood sugar checked twice a day  . Hyperlipemia   . Constipation     Past Surgical History  Procedure Laterality Date  . Total hip arthroplasty Right   . Mastectomy Bilateral   . Joint replacement    . Amputation Right 05/03/2013    Procedure: AMPUTATION BELOW KNEE - RIGHT;  Surgeon: Conrad Bertha, MD;  Location: [facility];  Service: Vascular;  Laterality: Right;    History   Social History  . Marital Status: Widowed    Spouse Name: N/A    Number of Children: N/A  . Years of Education: N/A   Occupational History  . Not on file.   Social History Main Topics  . Smoking status: Former Smoker -- 0.50 packs/day for 50 years    Types: Cigarettes    Quit date: 04/08/2013  . Smokeless tobacco: Never Used  . Alcohol Use: No  . Drug Use: No  . Sexual Activity: No   Other Topics Concern  . Not on file   Social History Narrative  . No narrative on file    Family History  Problem Relation Age of Onset  . Heart disease Mother   . Hypertension Mother   . Cancer Father   . Cancer Daughter     Current Outpatient  Prescriptions on File Prior to Visit  Medication Sig Dispense Refill  . atenolol (TENORMIN) 25 MG tablet Take 25 mg by mouth daily.      . pravastatin (PRAVACHOL) 10 MG tablet Take 10 mg by mouth daily.      . sennosides-docusate sodium (SENOKOT-S) 8.6-50 MG tablet Take 1 tablet by mouth 2 (two) times daily.      Marland Kitchen aspirin 81 MG tablet Take 81 mg by mouth daily.      . cephALEXin (KEFLEX) 500 MG capsule       . ciprofloxacin (CIPRO) 500 MG tablet       . HYDROcodone-acetaminophen (NORCO/VICODIN) 5-325 MG per tablet Take 1 tablet by mouth every 4 (four) hours as needed.  30 tablet  0  . nicotine (NICODERM CQ - DOSED IN MG/24 HOURS) 14 mg/24hr patch Place 1 patch onto the skin daily.  28 patch  3  . nitroGLYCERIN (NITROSTAT) 0.4 MG SL tablet Place 0.4 mg under the tongue every 5 (five) minutes as needed for chest pain.      Marland Kitchen oxyCODONE (ROXICODONE) 5 MG immediate release tablet Take 1 tablet (5 mg total) by mouth every 6 (six) hours as needed for pain.  30 tablet  0  . senna-docusate (SENOKOT-S) 8.6-50 MG per tablet Take  2 tablets by mouth 2 (two) times daily.  60 tablet  3  . sorbitol 70 % SOLN Take 50 mLs by mouth 2 (two) times daily at 10 am and 4 pm.  4000 mL  3   No current facility-administered medications on file prior to visit.    No Known Allergies  REVIEW OF SYSTEMS:  (Positives checked otherwise negative)  CARDIOVASCULAR:  [ ]  chest pain, [ ]  chest pressure, [ ]  palpitations, [ ]  shortness of breath when laying flat, [ ]  shortness of breath with exertion,   [x ] pain in feet when walking, [x ] pain in feet when laying flat, [ ]  history of blood clot in veins (DVT), [ ]  history of phlebitis, [ ]  swelling in legs, [x ] varicose veins  PULMONARY:  [ ]  productive cough, [ ]  asthma, [ ]  wheezing  NEUROLOGIC:  [ ]  weakness in arms or legs, [ x] numbness in arms or legs, [ ]  difficulty speaking or slurred speech, [ x] temporary loss of vision in one eye, [x ] dizziness  HEMATOLOGIC:  [  ] bleeding problems, [ ]  problems with blood clotting too easily  MUSCULOSKEL:  [ ]  joint pain, [ ]  joint swelling  GASTROINTEST:  [ ]   Vomiting blood, [ ]   Blood in stool     GENITOURINARY:  [ ]   Burning with urination, [ ]   Blood in urine  PSYCHIATRIC:  [ ]  history of major depression  INTEGUMENTARY:  [ ]  rashes, [ ]  ulcers  CONSTITUTIONAL:  [ ]  fever, [ ]  chills  For VQI Use Only   PRE-ADM LIVING: Home  AMB STATUS: Ambulatory with Assistance  CAD Sx: None  PRIOR CHF: None  STRESS TEST: [x]  No, [ ]  Normal, [ ]  + ischemia, [ ]  + MI, [ ]  Both   Physical Examination Filed Vitals:   11/26/13 1039  BP: 170/84  Pulse: 68  Resp: 16  Height: 5\' 2"  (1.575 m)  Weight: 153 lb (69.4 kg)   Body mass index is 27.98 kg/(m^2).  General: A&O x 3, WDWN  Head: [place]/AT  Ear/Nose/Throat: Hearing grossly intact, nares w/o erythema or drainage, oropharynx w/o Erythema/Exudate  Eyes: PERRLA, EOMI  Neck: Supple, no nuchal rigidity, no palpable LAD  Pulmonary: Sym exp, good air movt, CTAB, no rales, rhonchi, & wheezing  Cardiac: RRR, Nl S1, S2, no Murmurs, rubs or gallops  Vascular: Vessel Right Left  Radial Palpable Palpable  Brachial  Palpable Palpable  Carotid Palpable, without bruit Palpable, without bruit  Aorta Not palpable N/A  Femoral Palpable Palpable  Popliteal Not palpable Not palpable  PT BKA Palpable  DP BKA Palpable   Gastrointestinal: soft, NTND, -G/R, - HSM, - masses, - CVAT B  Musculoskeletal: M/S 5/5 throughout , Extremities without ischemic changes except R BKA and L dependent rubor  Neurologic: CN 2-12 intact , Pain and light touch intact in extremities , Motor exam as listed above  Psychiatric: Judgment intact, Mood & affect appropriatefor pt's clinical situation  Dermatologic: See M/S exam for extremity exam, no rashes otherwise noted  Lymph : No Cervical, Axillary, or Inguinal lymphadenopathy   Non-Invasive Vascular Imaging  L ABI (Date:  11/26/2013)  L: 0.69, DP: mono, PT: mono, TBI: 0.36  Outside Studies/Documentation 5 pages of outside documents were reviewed including: outpatient clinic chart.  Medical Decision Making  Lindsay Bentley is a 80 y.o. female who presents with: left leg peripheral arterial disease    Her L ABI puts her in the  intermittent claudication category though she doesn't really have that much claudication as her ambulation is limited.    I would manage her initially medically.  Her previous previous angiogram was consistent with diffuse superficial femoral artery and tibial disease which is likely not amendable to any simple intervention.  I discussed with the patient the natural history of intermittent claudication: 75% of patients have stable or improved symptoms in a year an only 2% require amputation. Eventually 20% may require intervention in a year.  I discussed in depth with the patient the nature of atherosclerosis, and emphasized the importance of maximal medical management including strict control of blood pressure, blood glucose, and lipid levels, antiplatelet agent, obtaining regular exercise, and cessation of smoking.    The patient is aware that without maximal medical management the underlying atherosclerotic disease process will progress, limiting the benefit of any interventions. The patient is currently on a statin: Pravachol. The patient is currently on an anti-platelet: ASA.  Thank you for allowing [place] to participate in this patient's care.  Adele Barthel, MD [facility] [place] Office: [phone] Pager: [phone]  11/26/2013, 4:21 PM

## 2014-01-20 ENCOUNTER — Encounter: Payer: Self-pay | Admitting: Vascular Surgery

## 2014-01-21 ENCOUNTER — Ambulatory Visit: Payer: Medicare PPO | Admitting: Vascular Surgery

## 2014-01-21 ENCOUNTER — Encounter (HOSPITAL_COMMUNITY): Payer: Medicare PPO

## 2014-02-03 ENCOUNTER — Encounter: Payer: Self-pay | Admitting: Vascular Surgery

## 2014-02-04 ENCOUNTER — Other Ambulatory Visit: Payer: Self-pay | Admitting: Vascular Surgery

## 2014-02-04 ENCOUNTER — Ambulatory Visit (HOSPITAL_COMMUNITY)
Admission: RE | Admit: 2014-02-04 | Discharge: 2014-02-04 | Disposition: A | Discharge status: Home / Self Care | Payer: Medicare PPO | Source: Ambulatory Visit | Attending: Vascular Surgery | Admitting: Vascular Surgery

## 2014-02-04 ENCOUNTER — Other Ambulatory Visit (HOSPITAL_COMMUNITY): Payer: Self-pay | Admitting: Vascular Surgery

## 2014-02-04 ENCOUNTER — Ambulatory Visit (INDEPENDENT_AMBULATORY_CARE_PROVIDER_SITE_OTHER): Payer: Medicare PPO | Admitting: Vascular Surgery

## 2014-02-04 ENCOUNTER — Encounter: Payer: Self-pay | Admitting: Vascular Surgery

## 2014-02-04 VITALS — BP 128/77 | HR 58 | Ht 62.0 in | Wt 153.0 lb

## 2014-02-04 DIAGNOSIS — I739 Peripheral vascular disease, unspecified: Secondary | ICD-10-CM

## 2014-02-04 DIAGNOSIS — I872 Venous insufficiency (chronic) (peripheral): Secondary | ICD-10-CM | POA: Insufficient documentation

## 2014-02-04 DIAGNOSIS — I70219 Atherosclerosis of native arteries of extremities with intermittent claudication, unspecified extremity: Secondary | ICD-10-CM

## 2014-02-04 DIAGNOSIS — M79609 Pain in unspecified limb: Secondary | ICD-10-CM | POA: Diagnosis present

## 2014-02-04 NOTE — Progress Notes (Signed)
HISTORY AND PHYSICAL   CC:  Pain in left leg and non healing wound on ball of foot  Lindsay Bentley,*  HPI: This is a 80 y.o. female who presents today with continued complaints of pain in her left leg.  She states that she will get a shooting pain in her left calf when walking, but this improves with rest.  She also states that she has a wound on the ball of her foot that was a small area that was starting back when she saw Dr. Bridgett Bentley in May and has continued to enlarge.  She states there has been no drainage from this.  She denies any fever or chills.    She is on a statin for her cholesterol.  She is on a beta blocker for her HTN.    Past Medical History  Diagnosis Date  . Hypertension   . Gangrene of foot   . Peripheral vascular disease   . Hyperglycemia     has Blood sugar checked twice a day  . Hyperlipemia   . Constipation    Past Surgical History  Procedure Laterality Date  . Total hip arthroplasty Right   . Mastectomy Bilateral   . Joint replacement    . Amputation Right 05/03/2013    Procedure: AMPUTATION BELOW KNEE - RIGHT;  Surgeon: Lindsay Pajaros, MD;  Location: [facility];  Service: Vascular;  Laterality: Right;    No Known Allergies  Current Outpatient Prescriptions  Medication Sig Dispense Refill  . atenolol (TENORMIN) 50 MG tablet       . clopidogrel (PLAVIX) 75 MG tablet Take 75 mg by mouth daily with breakfast.      . HYDROcodone-acetaminophen (NORCO/VICODIN) 5-325 MG per tablet Take 1 tablet by mouth every 4 (four) hours as needed.  30 tablet  0  . meperidine (DEMEROL) 50 MG tablet       . pravastatin (PRAVACHOL) 10 MG tablet Take 10 mg by mouth daily.      Marland Kitchen aspirin 81 MG tablet Take 81 mg by mouth daily.      Marland Kitchen atenolol (TENORMIN) 25 MG tablet Take 25 mg by mouth daily.      . cephALEXin (KEFLEX) 500 MG capsule       . ciprofloxacin (CIPRO) 500 MG tablet       . nicotine (NICODERM CQ - DOSED IN MG/24 HOURS) 14 mg/24hr patch Place 1 patch onto the skin  daily.  28 patch  3  . nitroGLYCERIN (NITROSTAT) 0.4 MG SL tablet Place 0.4 mg under the tongue every 5 (five) minutes as needed for chest pain.      Marland Kitchen oxyCODONE (ROXICODONE) 5 MG immediate release tablet Take 1 tablet (5 mg total) by mouth every 6 (six) hours as needed for pain.  30 tablet  0  . senna-docusate (SENOKOT-S) 8.6-50 MG per tablet Take 2 tablets by mouth 2 (two) times daily.  60 tablet  3  . sennosides-docusate sodium (SENOKOT-S) 8.6-50 MG tablet Take 1 tablet by mouth 2 (two) times daily.      . sorbitol 70 % SOLN Take 50 mLs by mouth 2 (two) times daily at 10 am and 4 pm.  4000 mL  3   No current facility-administered medications for this visit.    Family History  Problem Relation Age of Onset  . Heart disease Mother   . Hypertension Mother   . Cancer Father   . Cancer Daughter     History   Social History  .  Marital Status: Widowed    Spouse Name: N/A    Number of Children: N/A  . Years of Education: N/A   Occupational History  . Not on file.   Social History Main Topics  . Smoking status: Former Smoker -- 0.50 packs/day for 50 years    Types: Cigarettes    Quit date: 04/08/2013  . Smokeless tobacco: Never Used  . Alcohol Use: No  . Drug Use: No  . Sexual Activity: No   Other Topics Concern  . Not on file   Social History Narrative  . No narrative on file     ROS: [x]  Positive   [ ]  Negative   [ ]  All sytems reviewed and are negative  Cardiovascular: []  chest pain/pressure []  palpitations []  SOB lying flat []  DOE []  pain in legs while walking []  pain in feet when lying flat []  hx of DVT []  hx of phlebitis []  swelling in legs []  varicose veins  Pulmonary: []  productive cough []  asthma []  wheezing  Neurologic: []  weakness in []  arms []  legs []  numbness in []  arms []  legs [] difficulty speaking or slurred speech []  temporary loss of vision in one eye []  dizziness  Hematologic: []  bleeding problems []  problems with blood clotting  easily  GI []  vomiting blood []  blood in stool  GU: []  burning with urination []  blood in urine  Psychiatric: []  hx of major depression  Integumentary: []  rashes [x]  ulcers  Constitutional: []  fever []  chills   PHYSICAL EXAMINATION:  Filed Vitals:   02/04/14 1616  BP: 128/77  Pulse: 58   Body mass index is 27.98 kg/(m^2).  General:  WDWN in NAD Gait: Not observed HENT: WNL, normocephalic Eyes: Pupils equal Pulmonary: normal non-labored breathing , without Rales, rhonchi,  wheezing Cardiac: RRR, without  Murmurs, rubs or gallops; without carotid bruits Abdomen: soft, NT, no masses Skin: without rashes, with ulcers on ball of left foot Vascular Exam/Pulses:  Right Left  Radial 2+ (normal) 2+ (normal)  Popliteal amputation Non palpable  DP amputation + doppler signal  PT ampuation + doppler signal    Extremities: without ischemic changes, without Gangrene , without cellulitis; with a punctate ulcer of the 1st metatarsal;  Musculoskeletal: no muscle wasting or atrophy  Neurologic: A&O X 3; Appropriate Affect ; SENSATION: normal; MOTOR FUNCTION:  moving all extremities equally. Speech is fluent/normal   Non-Invasive Vascular Imaging:  Lower extremity venous duplex  1.  No evidence of deep vein thrombosis noted in the left femoral-popliteal venous system , based on limited visualization 2.  No evidence of SVT noted in the left lower extremity 3.  Venous incompetence is noted in the left GSV, CFV, and popliteal veins.  Pt meds includes: Statin:  Yes.   Beta Blocker:  Yes.   Aspirin:  Yes.   ACEI:  No. ARB:  No. Other Antiplatelet/Anticoagulant:  Yes.  -Plavix   ASSESSMENT/PLAN:: 80 y.o. female with left lower extremity pain and a new wound on left foot.   -2.5 months ago, she had a left ABI that put her in the intermittent claudication category.  She does have some venous incompetence is noted in the left GSV, CFV, and popliteal vein. -she has mixed  arterial / venous disease of her left lower extremity. -will refer her to the wound care center for further treatment of her left foot wound. -will f/u with Dr. Bridgett Bentley in 3 months for repeat ABI   Lindsay Locket, PA-C [facility] [phone]  Clinic  MD:  Pt seen and examined in conjunction with Dr. Bridgett Bentley  Addendum  I have independently interviewed and examined the patient, and I agree with the physician assistant's findings.  Some of this patient's L foot appearance is due to venous insufficiency and venous pooling in the right foot.  The first MT ulcer appears to be shallow but the degree of callous may be minimizing the appearance of the disease present.  I am referring this patient to wound care for debridement of the callous and mgmt of this wound.  I have discussed with the patient and family trying to minimize the interventions performed in this patient given her poor overall state of health.  If she does not heal with wound care interventions, further evaluation with angiography would be indicated.  The patient will have repeat ABI in 3 months.  Adele Barthel, MD [facility] [place] Office: [phone] Pager: [phone]  02/04/2014, 5:34 PM

## 2014-02-17 ENCOUNTER — Encounter (HOSPITAL_BASED_OUTPATIENT_CLINIC_OR_DEPARTMENT_OTHER): Payer: Medicare PPO | Attending: Internal Medicine

## 2014-02-17 DIAGNOSIS — Z7982 Long term (current) use of aspirin: Secondary | ICD-10-CM | POA: Diagnosis not present

## 2014-02-17 DIAGNOSIS — Z853 Personal history of malignant neoplasm of breast: Secondary | ICD-10-CM | POA: Insufficient documentation

## 2014-02-17 DIAGNOSIS — Z9010 Acquired absence of unspecified breast and nipple: Secondary | ICD-10-CM | POA: Diagnosis not present

## 2014-02-17 DIAGNOSIS — L97509 Non-pressure chronic ulcer of other part of unspecified foot with unspecified severity: Secondary | ICD-10-CM | POA: Insufficient documentation

## 2014-02-17 DIAGNOSIS — I10 Essential (primary) hypertension: Secondary | ICD-10-CM | POA: Insufficient documentation

## 2014-02-17 DIAGNOSIS — S88119A Complete traumatic amputation at level between knee and ankle, unspecified lower leg, initial encounter: Secondary | ICD-10-CM | POA: Diagnosis not present

## 2014-02-17 DIAGNOSIS — I739 Peripheral vascular disease, unspecified: Secondary | ICD-10-CM | POA: Diagnosis not present

## 2014-02-18 LAB — GLUCOSE, CAPILLARY: Glucose-Capillary: 115 mg/dL — ABNORMAL HIGH (ref 70–99)

## 2014-02-21 NOTE — Progress Notes (Signed)
[facility]  NAME:  Lindsay Bentley, Lindsay Bentley                       ACCOUNT NO.:  MEDICAL RECORD NO.:  [id number]      DATE OF BIRTH:  [birth year]  PHYSICIAN:  Judene Companion, M.D.           VISIT DATE:                                  OFFICE VISIT   This lady comes to [place], sent by Dr. Reinaldo Meeker, because of persistent ulcer on the medial aspect of her left foot.  This lady has marked problems with ischemia and peripheral vascular disease.  She has already had a below-knee amputation on the right side.  I do not feel any pulses at all on this left foot.  She does have some slowing of her capillary fill.  There is no edema.  She also has a history of hypertension.  She has also had a mastectomy for carcinoma of the breast.  Her medicines include Tenormin 25 mg a day.  She also takes aspirin, hydrocodone, nitroglycerin, and sorbitol.  She was examined here and found to have about a 3-mm tumor on the medial side of her foot, and we are going to treat it with silver collagen and offloading it with felt, and we will see her back here in a week.     Judene Companion, M.D.     PP/MEDQ  D:  02/17/2014  T:  02/18/2014  Job:  [id number]

## 2014-02-23 DIAGNOSIS — L97509 Non-pressure chronic ulcer of other part of unspecified foot with unspecified severity: Secondary | ICD-10-CM | POA: Diagnosis not present

## 2014-02-23 DIAGNOSIS — S88119A Complete traumatic amputation at level between knee and ankle, unspecified lower leg, initial encounter: Secondary | ICD-10-CM | POA: Diagnosis not present

## 2014-02-23 DIAGNOSIS — I739 Peripheral vascular disease, unspecified: Secondary | ICD-10-CM | POA: Diagnosis not present

## 2014-02-23 DIAGNOSIS — I10 Essential (primary) hypertension: Secondary | ICD-10-CM | POA: Diagnosis not present

## 2014-03-04 DIAGNOSIS — I739 Peripheral vascular disease, unspecified: Secondary | ICD-10-CM | POA: Diagnosis not present

## 2014-03-04 DIAGNOSIS — S88119A Complete traumatic amputation at level between knee and ankle, unspecified lower leg, initial encounter: Secondary | ICD-10-CM | POA: Diagnosis not present

## 2014-03-04 DIAGNOSIS — I10 Essential (primary) hypertension: Secondary | ICD-10-CM | POA: Diagnosis not present

## 2014-03-04 DIAGNOSIS — L97509 Non-pressure chronic ulcer of other part of unspecified foot with unspecified severity: Secondary | ICD-10-CM | POA: Diagnosis not present

## 2014-03-09 ENCOUNTER — Encounter (HOSPITAL_BASED_OUTPATIENT_CLINIC_OR_DEPARTMENT_OTHER): Payer: Medicare PPO | Attending: General Surgery

## 2014-03-09 DIAGNOSIS — L97409 Non-pressure chronic ulcer of unspecified heel and midfoot with unspecified severity: Secondary | ICD-10-CM | POA: Insufficient documentation

## 2014-03-23 DIAGNOSIS — L97409 Non-pressure chronic ulcer of unspecified heel and midfoot with unspecified severity: Secondary | ICD-10-CM | POA: Diagnosis not present

## 2014-05-13 ENCOUNTER — Encounter (HOSPITAL_COMMUNITY): Payer: Medicare PPO

## 2014-05-13 ENCOUNTER — Ambulatory Visit: Payer: Medicare PPO | Admitting: Vascular Surgery

## 2014-05-20 ENCOUNTER — Encounter (HOSPITAL_COMMUNITY): Payer: Medicare PPO

## 2014-05-20 ENCOUNTER — Ambulatory Visit: Payer: Medicare PPO | Admitting: Vascular Surgery

## 2014-05-26 ENCOUNTER — Encounter: Payer: Self-pay | Admitting: Vascular Surgery

## 2014-05-27 ENCOUNTER — Encounter: Payer: Self-pay | Admitting: Vascular Surgery

## 2014-05-27 ENCOUNTER — Ambulatory Visit: Payer: Medicare PPO | Admitting: Vascular Surgery

## 2014-05-27 ENCOUNTER — Encounter (HOSPITAL_COMMUNITY): Payer: Medicare PPO

## 2014-05-27 ENCOUNTER — Ambulatory Visit (HOSPITAL_COMMUNITY)
Admission: RE | Admit: 2014-05-27 | Discharge: 2014-05-27 | Disposition: A | Discharge status: Home / Self Care | Payer: Medicare PPO | Source: Ambulatory Visit | Attending: Vascular Surgery | Admitting: Vascular Surgery

## 2014-05-27 ENCOUNTER — Ambulatory Visit (INDEPENDENT_AMBULATORY_CARE_PROVIDER_SITE_OTHER): Payer: Medicare PPO | Admitting: Vascular Surgery

## 2014-05-27 VITALS — BP 129/64 | HR 56 | Temp 97.8°F | Resp 16 | Ht 62.0 in | Wt 151.0 lb

## 2014-05-27 DIAGNOSIS — I739 Peripheral vascular disease, unspecified: Secondary | ICD-10-CM | POA: Diagnosis not present

## 2014-05-27 DIAGNOSIS — I70299 Other atherosclerosis of native arteries of extremities, unspecified extremity: Secondary | ICD-10-CM | POA: Insufficient documentation

## 2014-05-27 DIAGNOSIS — L97909 Non-pressure chronic ulcer of unspecified part of unspecified lower leg with unspecified severity: Secondary | ICD-10-CM

## 2014-05-27 NOTE — Addendum Note (Signed)
Addended by: Mena Goes on: 05/27/2014 02:17 PM   Modules accepted: Orders

## 2014-05-27 NOTE — Progress Notes (Signed)
    Established Critical Limb Ischemia Patient  History of Present Illness  Lindsay Bentley is a 80 y.o. (Nov 23, 1927) female who presents with chief complaint: unhealed left 1st MT ulcer.  Pt is getting wound care .  Per pt, she thinks the wound is improving.  The patient has no rest pain.  She denies any fever or chills.  The patient notes symptoms have not progressed.  The patient's treatment regimen currently included: maximal medical management and wound care.  The patient's PMH, PSH, SH, FamHx, Med, and Allergies are unchanged from 02/04/14.  On ROS today: no rest pain, no fever or chills, no drainage from MT ulcer  Physical Examination  Filed Vitals:   05/27/14 1144  BP: 129/64  Pulse: 56  Temp: 97.8 F (36.6 C)  TempSrc: Oral  Resp: 16  Height: 5\' 2"  (1.575 m)  Weight: 151 lb (68.493 kg)  SpO2: 100%   Body mass index is 27.61 kg/(m^2).  General: A&O x 3, WDWN  Eyes: PERRLA, EOMI  Pulmonary: Sym exp, good air movt, CTAB, no rales, rhonchi, & wheezing  Cardiac: RRR, Nl S1, S2, no Murmurs, rubs or gallops  Vascular: Vessel Right Left  Radial Palpable Palpable  Brachial Palpable **Palpable  Carotid Palpable, without bruit Palpable, without bruit  Aorta Not palpable N/A  Femoral Palpable Palpable  Popliteal Not palpable Not palpable  PT BKA Not Palpable  DP BKA Not Palpable   Gastrointestinal: soft, NTND, -G/R, - HSM, - masses, - CVAT B  Musculoskeletal: M/S 5/5 throughout except R BKA, Extremities without ischemic changes except  R BKA and L 1st MT ulcer: clean after wiping off residual from Aquacel Ag  Neurologic: Pain and light touch intact in extremities except Left foot which is anesthetic, Motor exam as listed above  Non-Invasive Vascular Imaging L ABI (Date: 05/27/2014)  L: 0.69 (0.69), DP: mono, PT: mono, TBI:  NR  Medical Decision Making  Lindsay Bentley is a 80 y.o. female who presents with: critical limb ischemia with L 1st MT ulcer   Per the  pt, she thinks the ulcer is healing with wound care.  I would give her another 3 months of wound care before consider any further interventions as the patient is reluctant to have anything done  Based on the patient's vascular studies and examination, I have offered the patient: BLE ABI in 3 months..  I discussed in depth with the patient the nature of atherosclerosis, and emphasized the importance of maximal medical management including strict control of blood pressure, blood glucose, and lipid levels, antiplatelet agents, obtaining regular exercise, and cessation of smoking.    The patient is aware that without maximal medical management the underlying atherosclerotic disease process will progress, limiting the benefit of any interventions. The patient is currently on a statin: Pravachol. The patient is currently on an anti-platelet: Plavix  Thank you for allowing [place] to participate in this patient's care.  Adele Barthel, MD [facility] Office: [phone] Pager: [phone]  05/27/2014, 12:54 PM

## 2014-06-16 ENCOUNTER — Encounter (HOSPITAL_COMMUNITY): Payer: Self-pay | Admitting: Vascular Surgery

## 2014-08-25 ENCOUNTER — Encounter: Payer: Self-pay | Admitting: Vascular Surgery

## 2014-08-26 ENCOUNTER — Ambulatory Visit: Payer: Medicare PPO | Admitting: Vascular Surgery

## 2014-08-26 ENCOUNTER — Encounter (HOSPITAL_COMMUNITY): Payer: Medicare PPO

## 2014-10-13 ENCOUNTER — Encounter: Payer: Self-pay | Admitting: Vascular Surgery

## 2014-10-14 ENCOUNTER — Inpatient Hospital Stay (HOSPITAL_COMMUNITY): Admission: RE | Admit: 2014-10-14 | Payer: Medicare PPO | Source: Ambulatory Visit

## 2014-10-14 ENCOUNTER — Ambulatory Visit: Payer: Medicare PPO | Admitting: Vascular Surgery

## 2014-10-31 ENCOUNTER — Encounter (HOSPITAL_COMMUNITY): Payer: Medicare PPO

## 2014-10-31 ENCOUNTER — Ambulatory Visit: Payer: Medicare PPO | Admitting: Vascular Surgery

## 2015-03-16 ENCOUNTER — Encounter: Payer: Self-pay | Admitting: Vascular Surgery

## 2015-03-17 ENCOUNTER — Ambulatory Visit (HOSPITAL_COMMUNITY)
Admission: RE | Admit: 2015-03-17 | Discharge: 2015-03-17 | Disposition: A | Discharge status: Home / Self Care | Payer: Medicare PPO | Source: Ambulatory Visit | Attending: Vascular Surgery | Admitting: Vascular Surgery

## 2015-03-17 ENCOUNTER — Encounter: Payer: Self-pay | Admitting: Vascular Surgery

## 2015-03-17 ENCOUNTER — Other Ambulatory Visit: Payer: Self-pay | Admitting: Vascular Surgery

## 2015-03-17 ENCOUNTER — Ambulatory Visit (INDEPENDENT_AMBULATORY_CARE_PROVIDER_SITE_OTHER): Payer: Medicare PPO | Admitting: Vascular Surgery

## 2015-03-17 VITALS — BP 162/78 | HR 66 | Temp 98.5°F | Resp 16 | Ht 62.0 in | Wt 154.0 lb

## 2015-03-17 DIAGNOSIS — I70299 Other atherosclerosis of native arteries of extremities, unspecified extremity: Secondary | ICD-10-CM

## 2015-03-17 DIAGNOSIS — I872 Venous insufficiency (chronic) (peripheral): Secondary | ICD-10-CM | POA: Diagnosis not present

## 2015-03-17 DIAGNOSIS — L97329 Non-pressure chronic ulcer of left ankle with unspecified severity: Secondary | ICD-10-CM

## 2015-03-17 DIAGNOSIS — L97909 Non-pressure chronic ulcer of unspecified part of unspecified lower leg with unspecified severity: Secondary | ICD-10-CM | POA: Diagnosis not present

## 2015-03-17 DIAGNOSIS — Z87891 Personal history of nicotine dependence: Secondary | ICD-10-CM | POA: Diagnosis not present

## 2015-03-17 NOTE — Progress Notes (Signed)
Established Critical Limb Ischemia Patient  History of Present Illness  Lindsay Bentley is a 81 y.o. (12-04-1927) female  who presents with chief complaint: worsening L great toe ulcer.  Reportedly the wound was nearly healed when it rapidly began deteriorating.  Pt is getting wound care at [facility]. The patient has no rest pain. She denies any fever or chills. The patient notes symptoms have not progressed but the wound has. The patient's treatment regimen currently included: maximal medical management and wound care.  She was reportedly told she has osteoarthritis.  Past Medical History  Diagnosis Date  . Hypertension   . Gangrene of foot   . Peripheral vascular disease   . Hyperglycemia     has Blood sugar checked twice a day  . Hyperlipemia   . Constipation     Past Surgical History  Procedure Laterality Date  . Total hip arthroplasty Right   . Mastectomy Bilateral   . Joint replacement    . Amputation Right 05/03/2013    Procedure: AMPUTATION BELOW KNEE - RIGHT;  Surgeon: Conrad Parker, MD;  Location: [place];  Service: Vascular;  Laterality: Right;  . Abdominal aortagram N/A 04/07/2013    Procedure: ABDOMINAL AORTAGRAM;  Surgeon: Conrad Gorham, MD;  Location: [facility] CATH LAB;  Service: Cardiovascular;  Laterality: N/A;    Social History   Social History  . Marital Status: Widowed    Spouse Name: N/A  . Number of Children: N/A  . Years of Education: N/A   Occupational History  . Not on file.   Social History Main Topics  . Smoking status: Former Smoker -- 0.50 packs/day for 50 years    Types: Cigarettes    Quit date: 04/08/2013  . Smokeless tobacco: Never Used  . Alcohol Use: No  . Drug Use: No  . Sexual Activity: No   Other Topics Concern  . Not on file   Social History Narrative    Family History  Problem Relation Age of Onset  . Heart disease Mother   . Hypertension Mother   . Cancer Father   . Cancer Daughter     Current Outpatient Prescriptions   Medication Sig Dispense Refill  . ampicillin (PRINCIPEN) 250 MG capsule 4 (four) times daily - after meals and at bedtime.    Marland Kitchen atenolol (TENORMIN) 50 MG tablet     . Blood Glucose Monitoring Suppl (ONE TOUCH ULTRA 2) W/DEVICE KIT See admin instructions.  0  . clopidogrel (PLAVIX) 75 MG tablet Take 75 mg by mouth daily with breakfast.    . metFORMIN (GLUCOPHAGE) 500 MG tablet 250 mg 2 (two) times daily.  3  . ONE TOUCH ULTRA TEST test strip USE AS DIRECTED ONCE A DAY  0  . ONETOUCH DELICA LANCETS 65K MISC USE AS DIRECTED ONCE A DAY  0  . pravastatin (PRAVACHOL) 40 MG tablet     . senna-docusate (SENOKOT-S) 8.6-50 MG per tablet Take 2 tablets by mouth 2 (two) times daily. 60 tablet 3  . sulfamethoxazole-trimethoprim (BACTRIM DS,SEPTRA DS) 800-160 MG per tablet Take 1 tablet by mouth 2 (two) times daily.    Marland Kitchen acetaminophen-codeine (TYLENOL #3) 300-30 MG per tablet     . aspirin 81 MG tablet Take 81 mg by mouth daily.    . cephALEXin (KEFLEX) 500 MG capsule     . ciprofloxacin (CIPRO) 500 MG tablet     . HYDROcodone-acetaminophen (NORCO/VICODIN) 5-325 MG per tablet Take 1 tablet by mouth every 4 (  four) hours as needed. (Patient not taking: Reported on 03/17/2015) 30 tablet 0  . meperidine (DEMEROL) 50 MG tablet     . nicotine (NICODERM CQ - DOSED IN MG/24 HOURS) 14 mg/24hr patch Place 1 patch onto the skin daily. (Patient not taking: Reported on 03/17/2015) 28 patch 3  . nitroGLYCERIN (NITROSTAT) 0.4 MG SL tablet Place 0.4 mg under the tongue every 5 (five) minutes as needed for chest pain.    Marland Kitchen oxyCODONE (ROXICODONE) 5 MG immediate release tablet Take 1 tablet (5 mg total) by mouth every 6 (six) hours as needed for pain. (Patient not taking: Reported on 03/17/2015) 30 tablet 0  . sorbitol 70 % SOLN Take 50 mLs by mouth 2 (two) times daily at 10 am and 4 pm. (Patient not taking: Reported on 03/17/2015) 4000 mL 3   No current facility-administered medications for this visit.     No Known  Allergies   REVIEW OF SYSTEMS:  (Positives checked otherwise negative)  CARDIOVASCULAR:   [ ]  chest pain,  [ ]  chest pressure,  [ ]  palpitations,  [ ]  shortness of breath when laying flat,  [ ]  shortness of breath with exertion,   [ ]  pain in feet when walking,  [ ]  pain in feet when laying flat, [ ]  history of blood clot in veins (DVT),  [ ]  history of phlebitis,  [ ]  swelling in legs,  [ ]  varicose veins  PULMONARY:   [ ]  productive cough,  [ ]  asthma,  [ ]  wheezing  NEUROLOGIC:   [ ]  weakness in arms or legs,  [ ]  numbness in arms or legs,  [ ]  difficulty speaking or slurred speech,  [ ]  temporary loss of vision in one eye,  [ ]  dizziness  HEMATOLOGIC:   [ ]  bleeding problems,  [ ]  problems with blood clotting too easily  MUSCULOSKEL:   [ ]  joint pain, [ ]  joint swelling  GASTROINTEST:   [ ]  vomiting blood,  [ ]  blood in stool     GENITOURINARY:   [ ]  burning with urination,  [ ]  blood in urine  PSYCHIATRIC:   [ ]  history of major depression  INTEGUMENTARY:   [ ]  rashes,  [x]  ulcers  CONSTITUTIONAL:   [ ]  fever,  [ ]  chills    Physical Examination Filed Vitals:   03/17/15 1040 03/17/15 1050  BP: 155/88 162/78  Pulse: 67 66  Temp: 98.5 F (36.9 C)   TempSrc: Oral   Resp: 16   Height: 5' 2"  (1.575 m)   Weight: 154 lb (69.854 kg)   SpO2: 99%    Body mass index is 28.16 kg/(m^2).   General: A&O x 3, WDWN  Eyes: PERRLA, EOMI  Pulmonary: Sym exp, good air movt, CTAB, no rales, rhonchi, & wheezing  Cardiac: RRR, Nl S1, S2, no Murmurs, rubs or gallops  Vascular: Vessel Right Left  Radial Palpable Palpable  Brachial Palpable Palpable  Carotid Palpable, without bruit Palpable, without bruit  Aorta Not palpable N/A  Femoral Palpable Palpable  Popliteal Not palpable Not palpable  PT BKA Not Palpable  DP BKA Not Palpable   Gastrointestinal: soft, NTND, -G/R, - HSM, - masses, - CVAT B  Musculoskeletal: M/S  5/5 throughout except R BKA, R BKA, L great toe/MT medial half is ulcerated with suggestion of bone evident  Neurologic: Pain and light touch intact in extremities except Left foot which is anesthetic, Motor exam as listed above  ABI (Date: 03/17/2015) L: 0.54 (  0.69), DP: mono, PT: mono  Medical Decision Making  Lindsay Bentley is a 81 y.o. (03-08-1928) female who presents with: critical limb ischemia with L 1st toe/MT ulcer   I recommended: Aortogram, left leg runoff, and possible intervention.  The family is in agreement at this point.  I discussed with the patient the nature of angiographic procedures, especially the limited patencies of any endovascular intervention.  The patient is aware of that the risks of an angiographic procedure include but are not limited to: bleeding, infection, access site complications, renal failure, embolization, rupture of vessel, dissection, possible need for emergent surgical intervention, possible need for surgical procedures to treat the patient's pathology, anaphylactic reaction to contrast, and stroke and death.    The patient is aware of the risks and agrees to proceed.  She is scheduled for 22 SEP 16.  I discussed in depth with the patient the nature of atherosclerosis, and emphasized the importance of maximal medical management including strict control of blood pressure, blood glucose, and lipid levels, antiplatelet agents, obtaining regular exercise, and cessation of smoking.   The patient is aware that without maximal medical management the underlying atherosclerotic disease process will progress, limiting the benefit of any interventions.  The patient is currently on a statin: Pravachol.  The patient is currently on an anti-platelet: Plavix  Thank you for allowing [place] to participate in this patient's care.  Adele Barthel, MD [facility] Office: [phone] Pager: [phone]  03/17/2015, 12:23 PM

## 2015-03-21 ENCOUNTER — Other Ambulatory Visit: Payer: Self-pay

## 2015-03-30 ENCOUNTER — Ambulatory Visit (HOSPITAL_COMMUNITY)
Admission: RE | Admit: 2015-03-30 | Discharge: 2015-03-30 | Disposition: A | Discharge status: Home / Self Care | Payer: Medicare PPO | Source: Ambulatory Visit | Attending: Vascular Surgery | Admitting: Vascular Surgery

## 2015-03-30 ENCOUNTER — Encounter (HOSPITAL_COMMUNITY)
Admission: RE | Disposition: A | Discharge status: Home / Self Care | Payer: Self-pay | Source: Ambulatory Visit | Attending: Vascular Surgery

## 2015-03-30 DIAGNOSIS — L97909 Non-pressure chronic ulcer of unspecified part of unspecified lower leg with unspecified severity: Secondary | ICD-10-CM | POA: Diagnosis present

## 2015-03-30 DIAGNOSIS — E785 Hyperlipidemia, unspecified: Secondary | ICD-10-CM | POA: Insufficient documentation

## 2015-03-30 DIAGNOSIS — Z7902 Long term (current) use of antithrombotics/antiplatelets: Secondary | ICD-10-CM | POA: Diagnosis not present

## 2015-03-30 DIAGNOSIS — L97529 Non-pressure chronic ulcer of other part of left foot with unspecified severity: Secondary | ICD-10-CM | POA: Diagnosis not present

## 2015-03-30 DIAGNOSIS — Z96641 Presence of right artificial hip joint: Secondary | ICD-10-CM | POA: Diagnosis not present

## 2015-03-30 DIAGNOSIS — Z7982 Long term (current) use of aspirin: Secondary | ICD-10-CM | POA: Insufficient documentation

## 2015-03-30 DIAGNOSIS — I10 Essential (primary) hypertension: Secondary | ICD-10-CM | POA: Insufficient documentation

## 2015-03-30 DIAGNOSIS — K5900 Constipation, unspecified: Secondary | ICD-10-CM | POA: Diagnosis not present

## 2015-03-30 DIAGNOSIS — I70299 Other atherosclerosis of native arteries of extremities, unspecified extremity: Secondary | ICD-10-CM | POA: Diagnosis present

## 2015-03-30 DIAGNOSIS — R739 Hyperglycemia, unspecified: Secondary | ICD-10-CM | POA: Insufficient documentation

## 2015-03-30 DIAGNOSIS — I70245 Atherosclerosis of native arteries of left leg with ulceration of other part of foot: Secondary | ICD-10-CM | POA: Insufficient documentation

## 2015-03-30 DIAGNOSIS — Z87891 Personal history of nicotine dependence: Secondary | ICD-10-CM | POA: Diagnosis not present

## 2015-03-30 DIAGNOSIS — Z89511 Acquired absence of right leg below knee: Secondary | ICD-10-CM | POA: Diagnosis not present

## 2015-03-30 DIAGNOSIS — Z8249 Family history of ischemic heart disease and other diseases of the circulatory system: Secondary | ICD-10-CM | POA: Insufficient documentation

## 2015-03-30 HISTORY — PX: PERIPHERAL VASCULAR CATHETERIZATION: SHX172C

## 2015-03-30 LAB — GLUCOSE, CAPILLARY: Glucose-Capillary: 102 mg/dL — ABNORMAL HIGH (ref 65–99)

## 2015-03-30 LAB — POCT I-STAT, CHEM 8
BUN: 16 mg/dL (ref 6–20)
CALCIUM ION: 1.32 mmol/L — AB (ref 1.13–1.30)
CHLORIDE: 105 mmol/L (ref 101–111)
CREATININE: 1 mg/dL (ref 0.44–1.00)
GLUCOSE: 114 mg/dL — AB (ref 65–99)
HCT: 30 % — ABNORMAL LOW (ref 36.0–46.0)
Hemoglobin: 10.2 g/dL — ABNORMAL LOW (ref 12.0–15.0)
Potassium: 4.7 mmol/L (ref 3.5–5.1)
Sodium: 140 mmol/L (ref 135–145)
TCO2: 25 mmol/L (ref 0–100)

## 2015-03-30 SURGERY — ABDOMINAL AORTOGRAM
Anesthesia: LOCAL

## 2015-03-30 MED ORDER — HEPARIN (PORCINE) IN NACL 2-0.9 UNIT/ML-% IJ SOLN
INTRAMUSCULAR | Status: AC
  Filled 2015-03-30: qty 1000

## 2015-03-30 MED ORDER — LIDOCAINE HCL (PF) 1 % IJ SOLN
INTRAMUSCULAR | Status: AC
  Filled 2015-03-30: qty 30

## 2015-03-30 MED ORDER — MIDAZOLAM HCL 2 MG/2ML IJ SOLN
INTRAMUSCULAR | Status: DC | PRN
  Administered 2015-03-30: 1 mg via INTRAVENOUS

## 2015-03-30 MED ORDER — OXYCODONE-ACETAMINOPHEN 5-325 MG PO TABS: 1.0000 | ORAL_TABLET | Freq: Four times a day (QID) | ORAL | Status: DC | PRN

## 2015-03-30 MED ORDER — SODIUM CHLORIDE 0.9 % IV SOLN
INTRAVENOUS | Status: DC
  Administered 2015-03-30: 09:00:00 via INTRAVENOUS

## 2015-03-30 MED ORDER — FENTANYL CITRATE (PF) 100 MCG/2ML IJ SOLN
INTRAMUSCULAR | Status: AC
  Filled 2015-03-30: qty 4

## 2015-03-30 MED ORDER — SODIUM CHLORIDE 0.9 % IV SOLN: 1.0000 mL/kg/h | INTRAVENOUS | Status: DC

## 2015-03-30 MED ORDER — FENTANYL CITRATE (PF) 100 MCG/2ML IJ SOLN
INTRAMUSCULAR | Status: DC | PRN
  Administered 2015-03-30: 25 ug via INTRAVENOUS

## 2015-03-30 MED ORDER — LIDOCAINE HCL (PF) 1 % IJ SOLN
INTRAMUSCULAR | Status: DC | PRN
  Administered 2015-03-30: 15 mL

## 2015-03-30 MED ORDER — IODIXANOL 320 MG/ML IV SOLN
INTRAVENOUS | Status: DC | PRN
  Administered 2015-03-30: 55 mL via INTRAVENOUS

## 2015-03-30 MED ORDER — MIDAZOLAM HCL 2 MG/2ML IJ SOLN
INTRAMUSCULAR | Status: AC
  Filled 2015-03-30: qty 4

## 2015-03-30 SURGICAL SUPPLY — 12 items
CATH OMNI FLUSH 5F 65CM (CATHETERS) ×2 IMPLANT
CATH STRAIGHT 5FR 65CM (CATHETERS) ×2 IMPLANT
COVER PRB 48X5XTLSCP FOLD TPE (BAG) ×1 IMPLANT
COVER PROBE 5X48 (BAG) ×1
GUIDEWIRE ANGLED .035X150CM (WIRE) ×2 IMPLANT
KIT MICROINTRODUCER STIFF 5F (SHEATH) ×2 IMPLANT
KIT PV (KITS) ×2 IMPLANT
SHEATH PINNACLE 5F 10CM (SHEATH) ×2 IMPLANT
SYR MEDRAD MARK V 150ML (SYRINGE) ×2 IMPLANT
TRANSDUCER W/STOPCOCK (MISCELLANEOUS) ×2 IMPLANT
TRAY PV CATH (CUSTOM PROCEDURE TRAY) ×2 IMPLANT
WIRE BENTSON .035X145CM (WIRE) ×2 IMPLANT

## 2015-03-30 NOTE — Interval H&P Note (Signed)
History and Physical Interval Note:  03/30/2015 6:59 AM  Lindsay Bentley  has presented today for surgery, with the diagnosis of pvd with left great toe ulcer  The various methods of treatment have been discussed with the patient and family. After consideration of risks, benefits and other options for treatment, the patient has consented to  Procedure(s): Abdominal Aortogram (N/A) as a surgical intervention .  The patient's history has been reviewed, patient examined, no change in status, stable for surgery.  I have reviewed the patient's chart and labs.  Questions were answered to the patient's satisfaction.     Adele Barthel

## 2015-03-30 NOTE — Discharge Instructions (Signed)
Angiogram, Care After Refer to this sheet in the next few weeks. These instructions provide you with information on caring for yourself after your procedure. Your health care provider may also give you more specific instructions. Your treatment has been planned according to current medical practices, but problems sometimes occur. Call your health care provider if you have any problems or questions after your procedure.  WHAT TO EXPECT AFTER THE PROCEDURE After your procedure, it is typical to have the following sensations:  Minor discomfort or tenderness and a small bump at the catheter insertion site. The bump should usually decrease in size and tenderness within 1 to 2 weeks.  Any bruising will usually fade within 2 to 4 weeks. HOME CARE INSTRUCTIONS   You may need to keep taking blood thinners if they were prescribed for you. Take medicines only as directed by your health care provider.  Do not apply powder or lotion to the site.  Do not take baths, swim, or use a hot tub until your health care provider approves.  You may shower 24 hours after the procedure. Remove the bandage (dressing) and gently wash the site with plain soap and water. Gently pat the site dry.  Inspect the site at least twice daily.  Limit your activity for the first 48 hours. Do not bend, squat, or lift anything over 20 lb (9 kg) or as directed by your health care provider.  Plan to have someone take you home after the procedure. Follow instructions about when you can drive or return to work. SEEK MEDICAL CARE IF:  You get light-headed when standing up.  You have drainage (other than a small amount of blood on the dressing).  You have chills.  You have a fever.  You have redness, warmth, swelling, or pain at the insertion site. SEEK IMMEDIATE MEDICAL CARE IF:   You develop chest pain or shortness of breath, feel faint, or pass out.  You have bleeding, swelling larger than a walnut, or drainage from the  catheter insertion site.  You develop pain, discoloration, coldness, or severe bruising in the leg or arm that held the catheter.  You develop bleeding from any other place, such as the bowels. You may see bright red blood in your urine or stools, or your stools may appear black and tarry.  You have heavy bleeding from the site. If this happens, hold pressure on the site and call 911. MAKE SURE YOU:  Understand these instructions.  Will watch your condition.  Will get help right away if you are not doing well or get worse. Document Released: 01/10/2005 Document Revised: 11/08/2013 Document Reviewed: 11/16/2012 [facility] Patient Information 2015 [place], [place]. This information is not intended to replace advice given to you by your health care provider. Make sure you discuss any questions you have with your health care provider. Excuse from Work, [employer], or Physical Activity ________[name]__________________________________________ needs to be excused from: __XX___ Work _____ [employer] _____ Physical activity Beginning now and through the following date: _9/22/16__________________ _____ He/she may return to work or school but still avoid physical activity from now until: ____________________ _____ He/she may return to full physical activity as of: ____________________ Caregiver's signature: ________________________________________  Date: _______9/22/16_______________________________________________ Document Released: 12/18/2000 Document Revised: 09/16/2011 Document Reviewed: 06/24/2005 [employer] Patient Information 2015 [place]. This information is not intended to replace advice given to you by your health care provider. Make sure you discuss any questions you have with your health care provider.

## 2015-03-30 NOTE — H&P (View-Only) (Signed)
Established Critical Limb Ischemia Patient  History of Present Illness  Lindsay Bentley is a 81 y.o. (1927/11/18) female  who presents with chief complaint: worsening L great toe ulcer.  Reportedly the wound was nearly healed when it rapidly began deteriorating.  Pt is getting wound care at [facility]. The patient has no rest pain. She denies any fever or chills. The patient notes symptoms have not progressed but the wound has. The patient's treatment regimen currently included: maximal medical management and wound care.  She was reportedly told she has osteoarthritis.  Past Medical History  Diagnosis Date  . Hypertension   . Gangrene of foot   . Peripheral vascular disease   . Hyperglycemia     has Blood sugar checked twice a day  . Hyperlipemia   . Constipation     Past Surgical History  Procedure Laterality Date  . Total hip arthroplasty Right   . Mastectomy Bilateral   . Joint replacement    . Amputation Right 05/03/2013    Procedure: AMPUTATION BELOW KNEE - RIGHT;  Surgeon: Conrad Shubuta, MD;  Location: [place];  Service: Vascular;  Laterality: Right;  . Abdominal aortagram N/A 04/07/2013    Procedure: ABDOMINAL AORTAGRAM;  Surgeon: Conrad Buckner, MD;  Location: [facility] CATH LAB;  Service: Cardiovascular;  Laterality: N/A;    Social History   Social History  . Marital Status: Widowed    Spouse Name: N/A  . Number of Children: N/A  . Years of Education: N/A   Occupational History  . Not on file.   Social History Main Topics  . Smoking status: Former Smoker -- 0.50 packs/day for 50 years    Types: Cigarettes    Quit date: 04/08/2013  . Smokeless tobacco: Never Used  . Alcohol Use: No  . Drug Use: No  . Sexual Activity: No   Other Topics Concern  . Not on file   Social History Narrative    Family History  Problem Relation Age of Onset  . Heart disease Mother   . Hypertension Mother   . Cancer Father   . Cancer Daughter     Current Outpatient Prescriptions    Medication Sig Dispense Refill  . ampicillin (PRINCIPEN) 250 MG capsule 4 (four) times daily - after meals and at bedtime.    Marland Kitchen atenolol (TENORMIN) 50 MG tablet     . Blood Glucose Monitoring Suppl (ONE TOUCH ULTRA 2) W/DEVICE KIT See admin instructions.  0  . clopidogrel (PLAVIX) 75 MG tablet Take 75 mg by mouth daily with breakfast.    . metFORMIN (GLUCOPHAGE) 500 MG tablet 250 mg 2 (two) times daily.  3  . ONE TOUCH ULTRA TEST test strip USE AS DIRECTED ONCE A DAY  0  . ONETOUCH DELICA LANCETS 76L MISC USE AS DIRECTED ONCE A DAY  0  . pravastatin (PRAVACHOL) 40 MG tablet     . senna-docusate (SENOKOT-S) 8.6-50 MG per tablet Take 2 tablets by mouth 2 (two) times daily. 60 tablet 3  . sulfamethoxazole-trimethoprim (BACTRIM DS,SEPTRA DS) 800-160 MG per tablet Take 1 tablet by mouth 2 (two) times daily.    Marland Kitchen acetaminophen-codeine (TYLENOL #3) 300-30 MG per tablet     . aspirin 81 MG tablet Take 81 mg by mouth daily.    . cephALEXin (KEFLEX) 500 MG capsule     . ciprofloxacin (CIPRO) 500 MG tablet     . HYDROcodone-acetaminophen (NORCO/VICODIN) 5-325 MG per tablet Take 1 tablet by mouth every  4 (four) hours as needed. (Patient not taking: Reported on 03/17/2015) 30 tablet 0  . meperidine (DEMEROL) 50 MG tablet     . nicotine (NICODERM CQ - DOSED IN MG/24 HOURS) 14 mg/24hr patch Place 1 patch onto the skin daily. (Patient not taking: Reported on 03/17/2015) 28 patch 3  . nitroGLYCERIN (NITROSTAT) 0.4 MG SL tablet Place 0.4 mg under the tongue every 5 (five) minutes as needed for chest pain.    Marland Kitchen oxyCODONE (ROXICODONE) 5 MG immediate release tablet Take 1 tablet (5 mg total) by mouth every 6 (six) hours as needed for pain. (Patient not taking: Reported on 03/17/2015) 30 tablet 0  . sorbitol 70 % SOLN Take 50 mLs by mouth 2 (two) times daily at 10 am and 4 pm. (Patient not taking: Reported on 03/17/2015) 4000 mL 3   No current facility-administered medications for this visit.     No Known  Allergies   REVIEW OF SYSTEMS:  (Positives checked otherwise negative)  CARDIOVASCULAR:   [ ]  chest pain,  [ ]  chest pressure,  [ ]  palpitations,  [ ]  shortness of breath when laying flat,  [ ]  shortness of breath with exertion,   [ ]  pain in feet when walking,  [ ]  pain in feet when laying flat, [ ]  history of blood clot in veins (DVT),  [ ]  history of phlebitis,  [ ]  swelling in legs,  [ ]  varicose veins  PULMONARY:   [ ]  productive cough,  [ ]  asthma,  [ ]  wheezing  NEUROLOGIC:   [ ]  weakness in arms or legs,  [ ]  numbness in arms or legs,  [ ]  difficulty speaking or slurred speech,  [ ]  temporary loss of vision in one eye,  [ ]  dizziness  HEMATOLOGIC:   [ ]  bleeding problems,  [ ]  problems with blood clotting too easily  MUSCULOSKEL:   [ ]  joint pain, [ ]  joint swelling  GASTROINTEST:   [ ]  vomiting blood,  [ ]  blood in stool     GENITOURINARY:   [ ]  burning with urination,  [ ]  blood in urine  PSYCHIATRIC:   [ ]  history of major depression  INTEGUMENTARY:   [ ]  rashes,  [x]  ulcers  CONSTITUTIONAL:   [ ]  fever,  [ ]  chills    Physical Examination Filed Vitals:   03/17/15 1040 03/17/15 1050  BP: 155/88 162/78  Pulse: 67 66  Temp: 98.5 F (36.9 C)   TempSrc: Oral   Resp: 16   Height: 5' 2"  (1.575 m)   Weight: 154 lb (69.854 kg)   SpO2: 99%    Body mass index is 28.16 kg/(m^2).   General: A&O x 3, WDWN  Eyes: PERRLA, EOMI  Pulmonary: Sym exp, good air movt, CTAB, no rales, rhonchi, & wheezing  Cardiac: RRR, Nl S1, S2, no Murmurs, rubs or gallops  Vascular: Vessel Right Left  Radial Palpable Palpable  Brachial Palpable Palpable  Carotid Palpable, without bruit Palpable, without bruit  Aorta Not palpable N/A  Femoral Palpable Palpable  Popliteal Not palpable Not palpable  PT BKA Not Palpable  DP BKA Not Palpable   Gastrointestinal: soft, NTND, -G/R, - HSM, - masses, - CVAT B  Musculoskeletal: M/S  5/5 throughout except R BKA, R BKA, L great toe/MT medial half is ulcerated with suggestion of bone evident  Neurologic: Pain and light touch intact in extremities except Left foot which is anesthetic, Motor exam as listed above  ABI (Date: 03/17/2015) L:  0.54 (0.69), DP: mono, PT: mono  Medical Decision Making  ANYAH SWALLOW is a 81 y.o. (January 06, 1928) female who presents with: critical limb ischemia with L 1st toe/MT ulcer   I recommended: Aortogram, left leg runoff, and possible intervention.  The family is in agreement at this point.  I discussed with the patient the nature of angiographic procedures, especially the limited patencies of any endovascular intervention.  The patient is aware of that the risks of an angiographic procedure include but are not limited to: bleeding, infection, access site complications, renal failure, embolization, rupture of vessel, dissection, possible need for emergent surgical intervention, possible need for surgical procedures to treat the patient's pathology, anaphylactic reaction to contrast, and stroke and death.    The patient is aware of the risks and agrees to proceed.  She is scheduled for 22 SEP 16.  I discussed in depth with the patient the nature of atherosclerosis, and emphasized the importance of maximal medical management including strict control of blood pressure, blood glucose, and lipid levels, antiplatelet agents, obtaining regular exercise, and cessation of smoking.   The patient is aware that without maximal medical management the underlying atherosclerotic disease process will progress, limiting the benefit of any interventions.  The patient is currently on a statin: Pravachol.  The patient is currently on an anti-platelet: Plavix  Thank you for allowing [place] to participate in this patient's care.  Adele Barthel, MD [facility] [place] Office: [phone] Pager: [phone]  03/17/2015, 12:23 PM

## 2015-03-31 ENCOUNTER — Encounter (HOSPITAL_COMMUNITY): Payer: Self-pay | Admitting: Vascular Surgery

## 2015-04-04 ENCOUNTER — Telehealth: Payer: Self-pay | Admitting: Vascular Surgery

## 2015-04-04 NOTE — Telephone Encounter (Signed)
-----   Message from Mena Goes, RN sent at 03/30/2015 11:45 AM EDT ----- Regarding: Schedule   ----- Message -----    From: Conrad Morenci, MD    Sent: 03/30/2015  11:14 AM      To: [address]  MARGRET MOAT [id number] December 20, 1927  PROCEDURE: 1. right common femoral artery cannulation under ultrasound guidance 2. Placement of catheter in aorta 3. Aortogram 4. Second order arterial selection 5. Left leg runoff  Follow-up: 4 weeks

## 2015-04-04 NOTE — Telephone Encounter (Signed)
Spoke with pts daughter to schedule, dpm

## 2015-04-28 ENCOUNTER — Encounter: Payer: Self-pay | Admitting: Vascular Surgery

## 2015-04-29 IMAGING — CR DG FOOT COMPLETE 3+V*R*
3 series · 3 of 3 positions shown · non-contrast
Comparison: None.

CLINICAL DATA: Foot injury. Non healing wound between 3rd and 4th
toes

EXAM:
RIGHT FOOT COMPLETE - 3+ VIEW

[view not recorded (1 of 3)]
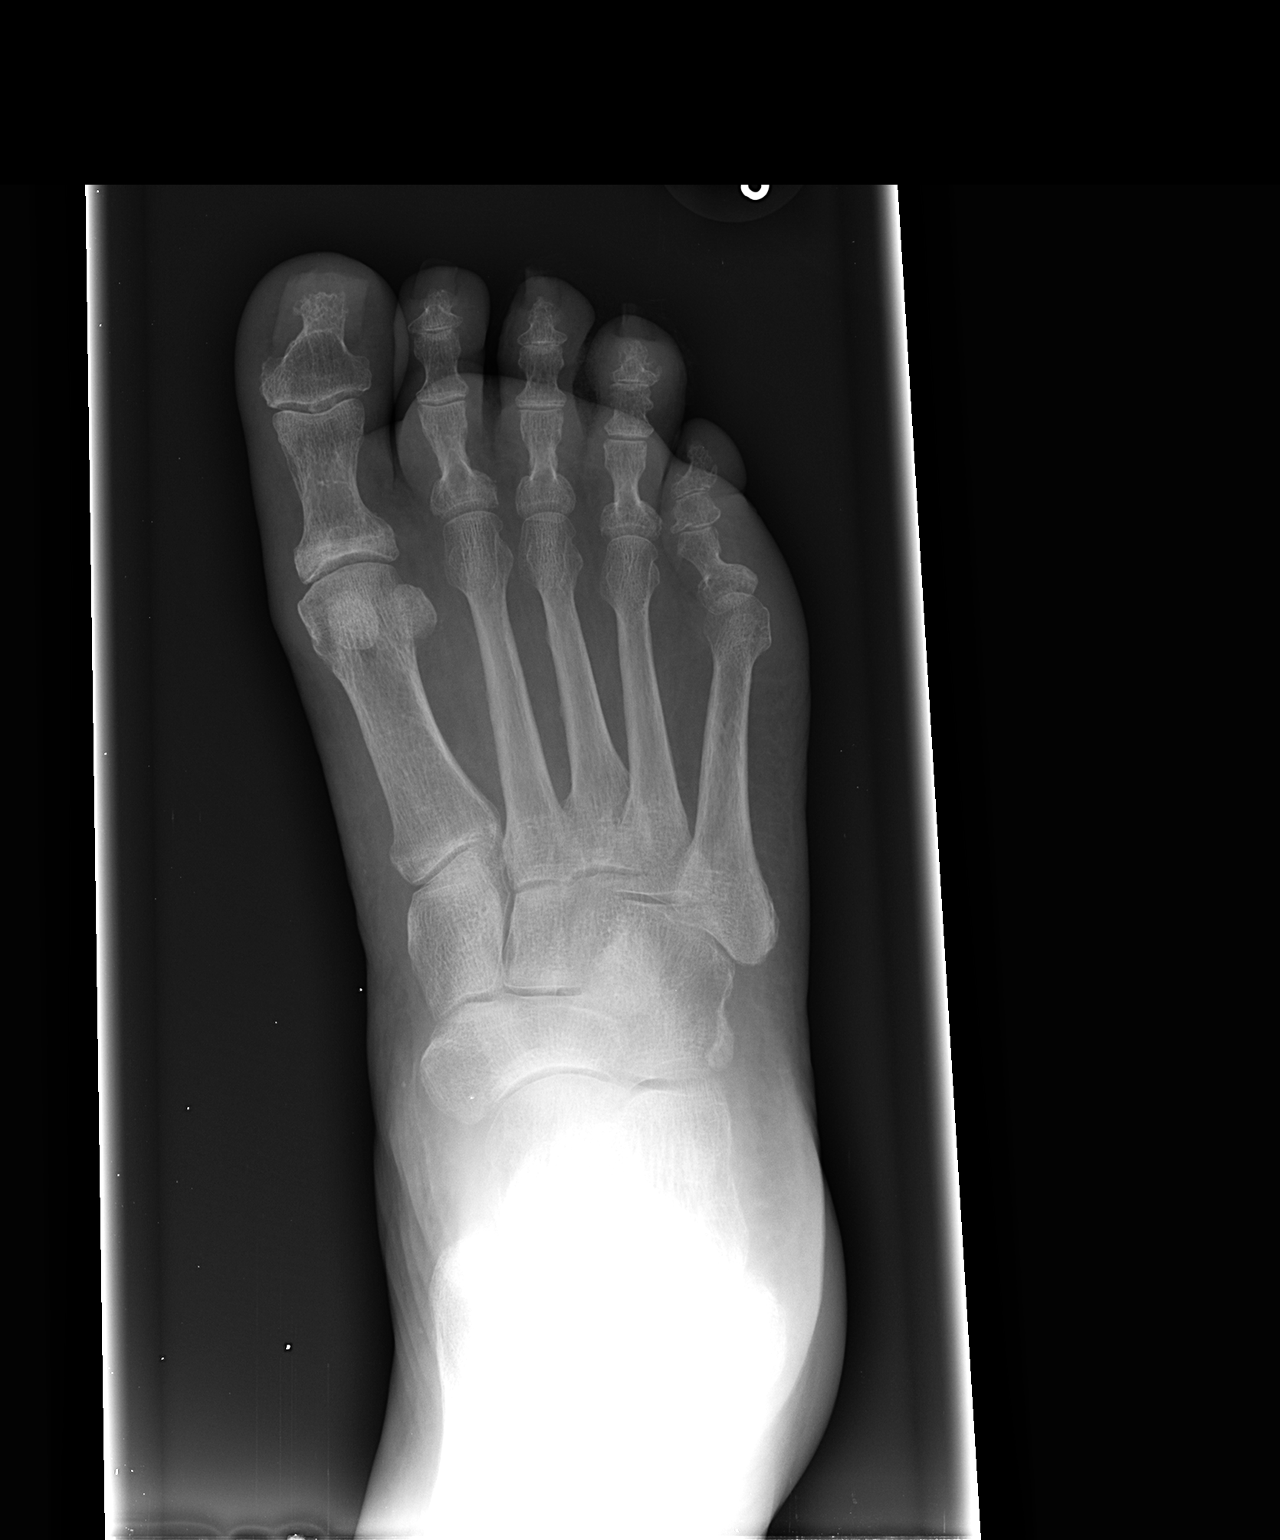

[view not recorded (2 of 3)]
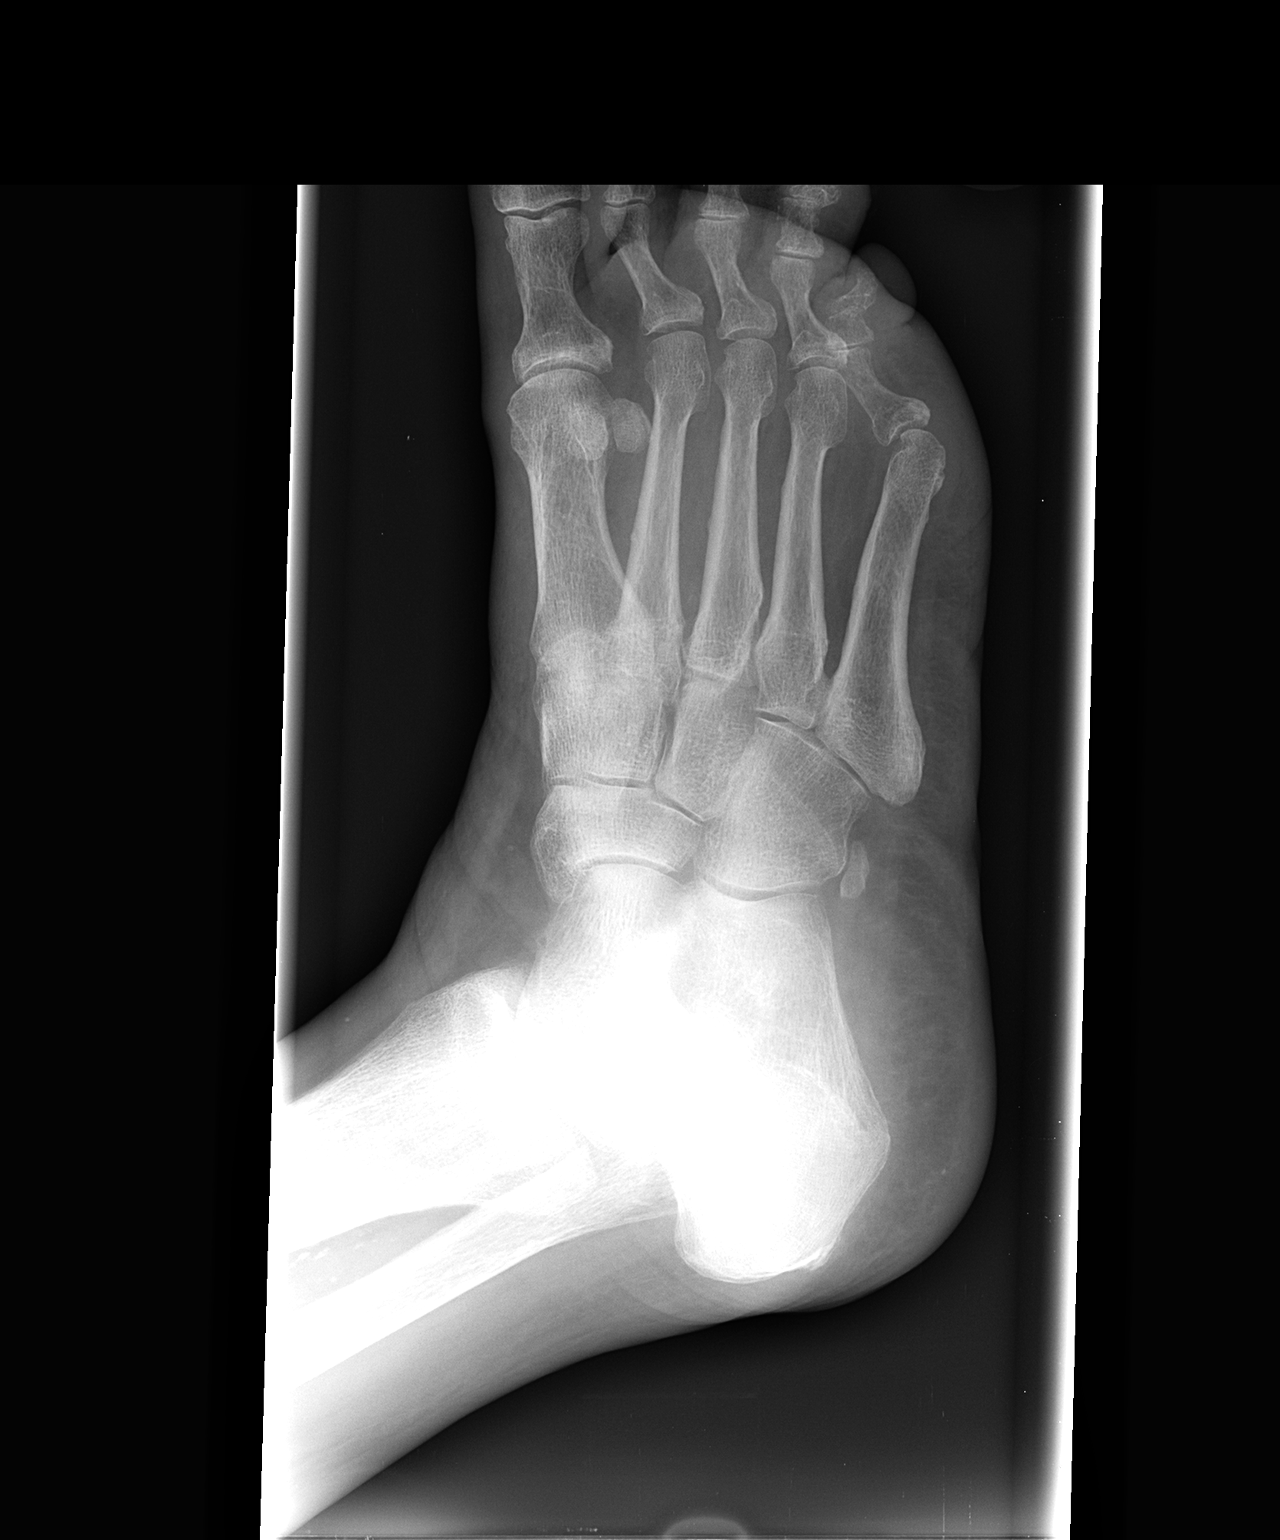

[view not recorded (3 of 3)]
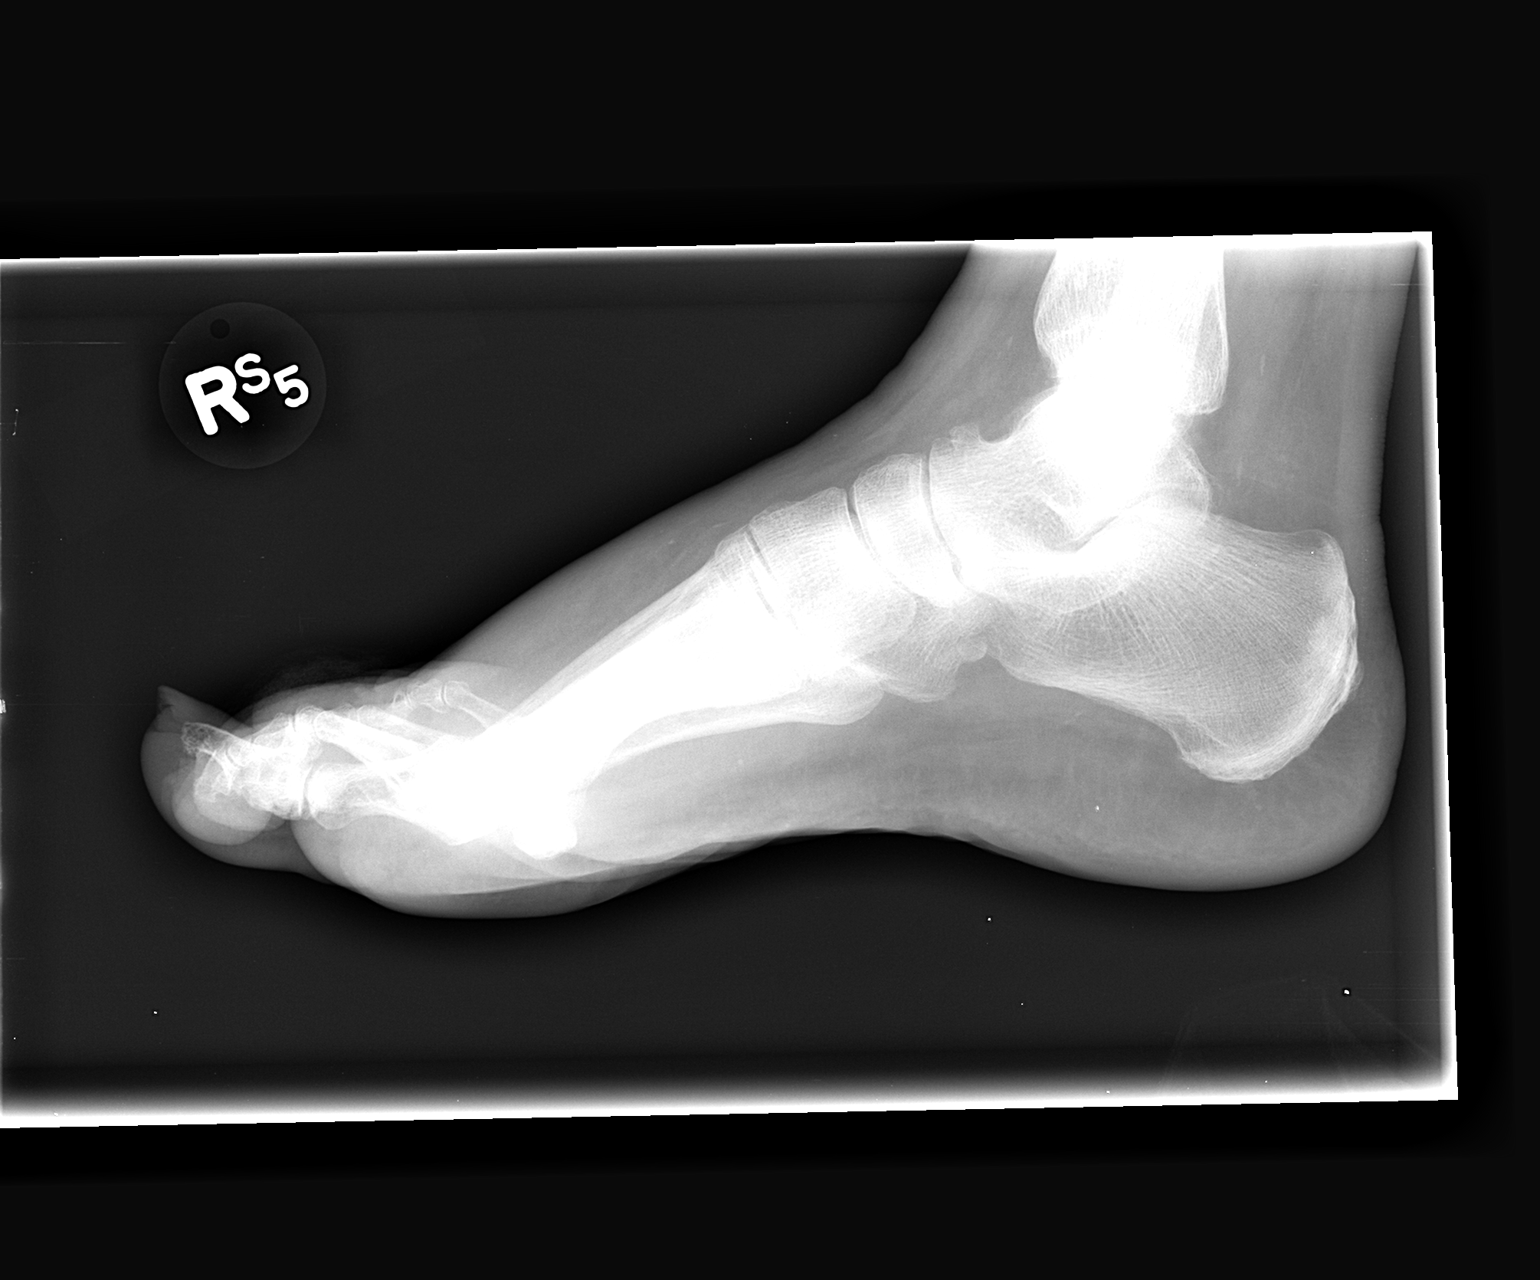

[3 of 3 positions shown; findings below may reference images not displayed]

FINDINGS: Normal alignment. Negative for fracture. No evidence of
osteomyelitis.
IMPRESSION: No acute abnormality.

## 2015-05-03 ENCOUNTER — Encounter: Payer: Self-pay | Admitting: Vascular Surgery

## 2015-05-03 ENCOUNTER — Ambulatory Visit (INDEPENDENT_AMBULATORY_CARE_PROVIDER_SITE_OTHER): Payer: Medicare PPO | Admitting: Vascular Surgery

## 2015-05-03 VITALS — BP 132/70 | HR 82 | Temp 97.6°F | Resp 16 | Ht 62.0 in | Wt 154.0 lb

## 2015-05-03 DIAGNOSIS — I70299 Other atherosclerosis of native arteries of extremities, unspecified extremity: Secondary | ICD-10-CM

## 2015-05-03 DIAGNOSIS — L97909 Non-pressure chronic ulcer of unspecified part of unspecified lower leg with unspecified severity: Secondary | ICD-10-CM

## 2015-05-03 NOTE — Progress Notes (Signed)
    Postoperative Visit   History of Present Illness  Lindsay Bentley is a 81 y.o. female who presents for postoperative follow-up from procedure on Date: 03/30/15: Aortogram, LRo .  The patient's wounds are not healed.  The patient notes minimal pain in L foot.  lower extremity symptoms.  The patient continues to have wound care.   The wound has not improved for months.  Physical Examination  Filed Vitals:   05/03/15 1211  BP: 132/70  Pulse: 82  Temp: 97.6 F (36.4 C)  TempSrc: Oral  Resp: 16  Height: '5\' 2"'$  (1.575 m)  Weight: 154 lb (69.854 kg)  SpO2: 100%   Body mass index is 28.16 kg/(m^2).  L foot: no erythema, no TTP to light touch, no drainage, L 1st MT ulcer down to [place] is a 81 y.o. female who presents s/p Ao, LRo, L 1st MT ulcer .  Angiography demonstrates no good surgical or endovascular options.  Only option would be a SAFARI which would result in death of the L foot if it failed.  I discussed with the patient her other options would be a L BKA.  The patient has expressed her desire to continued with maximal medical mgmt at this point: wound care, abx, pain rx as needed.   She has declined L BKA at this point but has not completely ruled it out.  Thank you for allowing [place] to participate in this patient's care.  Adele Barthel, MD [facility] Office: [phone] Pager: [phone]

## 2015-05-05 ENCOUNTER — Encounter: Payer: Medicare PPO | Admitting: Vascular Surgery

## 2016-03-21 DIAGNOSIS — R918 Other nonspecific abnormal finding of lung field: Secondary | ICD-10-CM

## 2016-03-21 DIAGNOSIS — Z853 Personal history of malignant neoplasm of breast: Secondary | ICD-10-CM

## 2016-03-29 DIAGNOSIS — R911 Solitary pulmonary nodule: Secondary | ICD-10-CM | POA: Diagnosis not present

## 2016-04-18 ENCOUNTER — Encounter: Payer: Self-pay | Admitting: Internal Medicine

## 2016-04-18 ENCOUNTER — Ambulatory Visit (INDEPENDENT_AMBULATORY_CARE_PROVIDER_SITE_OTHER): Payer: Medicare PPO | Admitting: Internal Medicine

## 2016-04-18 VITALS — BP 104/62 | HR 54

## 2016-04-18 DIAGNOSIS — R918 Other nonspecific abnormal finding of lung field: Secondary | ICD-10-CM | POA: Insufficient documentation

## 2016-04-18 NOTE — Progress Notes (Signed)
Subjective:    Patient ID: Lindsay Bentley, female    DOB: 05-27-1928    MRN: [id number]  HPI  71 yobf quit smoking 2014 due to to poorly healing ulcers on leg and limited then by legs with spells of difficult of breathing/ cough since late aug 2017 rx with nebs but with two episodes of hemoptysis > CT > PET ? Lesion in R Mainstem bronchus   04/18/2016 1st [employer] [facility] office visit/ [place]   Chief Complaint  Patient presents with  . Pulmonary Consult    Referred by Dr. Hosie Poisson. Pt c/o SOB x 6 months- occurs when she lies down and first thing in the am. She had hemoptysis a few times in Sept 2017 and was admitted to [facility]. She was dxed with PNA. She is coughing occ now- non prod.   breathing on R side down uncomfortable x sev months, does better on L  Last hemoptysis sev weeks prior to OV  And < tsp x sev episodes none since then   No obvious day to day or daytime variability or assoc excess/ purulent sputum or mucus plugs  or cp or chest tightness, subjective wheeze or overt sinus or hb symptoms. No unusual exp hx or h/o childhood pna/ asthma or knowledge of premature birth.  Sleeping ok when no on R side without nocturnal  or early am exacerbation  of respiratory  c/o's or need for noct saba. Also denies any obvious fluctuation of symptoms with weather or environmental changes or other aggravating or alleviating factors except as outlined above   Current Medications, Allergies, Complete Past Medical History, Past Surgical History, Family History, and Social History were reviewed in [employer] record.          Review of Systems  Constitutional: Negative for chills, fever and unexpected weight change.  HENT: Negative for congestion, dental problem, ear pain, nosebleeds, postnasal drip, rhinorrhea, sinus pressure, sneezing, sore throat, trouble swallowing and voice change.   Eyes: Negative for visual disturbance.  Respiratory: Positive  for cough and shortness of breath. Negative for choking.   Cardiovascular: Negative for chest pain and leg swelling.  Gastrointestinal: Negative for abdominal pain, diarrhea and vomiting.  Genitourinary: Negative for difficulty urinating.  Musculoskeletal: Negative for arthralgias.  Skin: Negative for rash.  Neurological: Negative for tremors, syncope and headaches.  Hematological: Does not bruise/bleed easily.       Objective:   Physical Exam  W/c bound pleasant elderly bf nad  Wt Readings from Last 3 Encounters:  05/03/15 154 lb (69.9 kg)  03/30/15 154 lb (69.9 kg)  03/17/15 154 lb (69.9 kg)    Vital signs reviewed - Note on arrival 02 sats  99% on RA     HEENT: nl dentition, turbinates, and oropharynx. Nl external ear canals without cough reflex   NECK :  without JVD/Nodes/TM/ nl carotid upstrokes bilaterally   LUNGS: no acc muscle use,  Nl contour chest with localized ins/exp rhonchi R of midline ant only   CV:  RRR  no s3 or murmur or increase in P2, no edema   ABD:  soft and nontender with nl inspiratory excursion in the supine position. No bruits or organomegaly, bowel sounds nl  MS:  Nl gait/ ext warm without deformities, calf tenderness, cyanosis or clubbing No obvious joint restrictions   SKIN: warm and dry without lesions    NEURO:  alert, approp, nl sensorium with  no motor deficits   PET 03/27/16  Hypermetabolic RMSB mass neg mets          Assessment & Plan:

## 2016-04-18 NOTE — Patient Instructions (Addendum)
Come to outpatient registration at [facility] (behind the ER) at 715 am Friday  04/19/16 with nothing to eat or drink after midnight Thursday.for Bronchoscopy with possible biopsy

## 2016-04-18 NOTE — Assessment & Plan Note (Signed)
See PET 03/27/16 with RMSB mass/net mets and localized wheeze on exam > FOB 04/19/16    Most likely this is bronchogenic ca but late endobronchial met from breast ca is also in ddx so def need tissue dx here.  Discussed in detail all the  indications, usual  risks and alternatives  relative to the benefits with patient/daughter who agree to proceed with bronchoscopy with biopsy  Total time devoted to counseling  = 35/56mreview case with pt and daughter including records from [place]/ imaging from there with "cartoon" graphic to demonstrate the problem / discussion of options/alternatives/ personally creating written instructions  in presence of pt  then going over those specific  Instructions directly with the pt

## 2016-04-19 ENCOUNTER — Encounter (HOSPITAL_COMMUNITY)
Admission: RE | Disposition: A | Discharge status: Home / Self Care | Payer: Self-pay | Source: Ambulatory Visit | Attending: Internal Medicine

## 2016-04-19 ENCOUNTER — Ambulatory Visit (HOSPITAL_COMMUNITY)
Admission: RE | Admit: 2016-04-19 | Discharge: 2016-04-19 | Disposition: A | Discharge status: Home / Self Care | Payer: Medicare PPO | Source: Ambulatory Visit | Attending: Internal Medicine | Admitting: Internal Medicine

## 2016-04-19 ENCOUNTER — Other Ambulatory Visit: Payer: Self-pay | Admitting: Internal Medicine

## 2016-04-19 DIAGNOSIS — Z853 Personal history of malignant neoplasm of breast: Secondary | ICD-10-CM | POA: Insufficient documentation

## 2016-04-19 DIAGNOSIS — R918 Other nonspecific abnormal finding of lung field: Secondary | ICD-10-CM | POA: Diagnosis present

## 2016-04-19 DIAGNOSIS — R0602 Shortness of breath: Secondary | ICD-10-CM | POA: Insufficient documentation

## 2016-04-19 DIAGNOSIS — R042 Hemoptysis: Secondary | ICD-10-CM | POA: Insufficient documentation

## 2016-04-19 DIAGNOSIS — Z87891 Personal history of nicotine dependence: Secondary | ICD-10-CM | POA: Diagnosis not present

## 2016-04-19 HISTORY — PX: VIDEO BRONCHOSCOPY: SHX5072

## 2016-04-19 LAB — GLUCOSE, CAPILLARY: Glucose-Capillary: 96 mg/dL (ref 65–99)

## 2016-04-19 SURGERY — VIDEO BRONCHOSCOPY WITHOUT FLUORO
Anesthesia: Moderate Sedation | Laterality: Bilateral

## 2016-04-19 MED ORDER — MEPERIDINE HCL 100 MG/ML IJ SOLN: 100.0000 mg | Freq: Once | INTRAMUSCULAR | Status: DC

## 2016-04-19 MED ORDER — MIDAZOLAM HCL 5 MG/ML IJ SOLN
INTRAMUSCULAR | Status: AC
  Filled 2016-04-19: qty 2

## 2016-04-19 MED ORDER — MIDAZOLAM HCL 5 MG/ML IJ SOLN
1.0000 mg | Freq: Once | INTRAMUSCULAR | Status: DC
Start: 2016-04-19 — End: 2016-04-19

## 2016-04-19 MED ORDER — LIDOCAINE HCL 2 % EX GEL: 1.0000 "application " | Freq: Once | CUTANEOUS | Status: DC

## 2016-04-19 MED ORDER — EPINEPHRINE PF 1 MG/10ML IJ SOSY
PREFILLED_SYRINGE | INTRAMUSCULAR | Status: DC | PRN
Start: 2016-04-19 — End: 2016-04-19
  Administered 2016-04-19: 0.5 mg via ENDOTRACHEOPULMONARY
  Administered 2016-04-19: 1 via ENDOTRACHEOPULMONARY

## 2016-04-19 MED ORDER — LIDOCAINE HCL 2 % EX GEL
CUTANEOUS | Status: DC | PRN
  Administered 2016-04-19: 1

## 2016-04-19 MED ORDER — PHENYLEPHRINE HCL 0.25 % NA SOLN: 1.0000 | Freq: Four times a day (QID) | NASAL | Status: DC | PRN

## 2016-04-19 MED ORDER — MEPERIDINE HCL 25 MG/ML IJ SOLN
INTRAMUSCULAR | Status: DC | PRN
  Administered 2016-04-19: 50 mg via INTRAVENOUS

## 2016-04-19 MED ORDER — PHENYLEPHRINE HCL 0.25 % NA SOLN
NASAL | Status: DC | PRN
  Administered 2016-04-19: 2 via NASAL

## 2016-04-19 MED ORDER — MIDAZOLAM HCL 10 MG/2ML IJ SOLN
INTRAMUSCULAR | Status: DC | PRN
  Administered 2016-04-19: 2.5 mg via INTRAVENOUS

## 2016-04-19 MED ORDER — LIDOCAINE HCL 1 % IJ SOLN
INTRAMUSCULAR | Status: DC | PRN
  Administered 2016-04-19: 6 mL via RESPIRATORY_TRACT

## 2016-04-19 MED ORDER — MEPERIDINE HCL 100 MG/ML IJ SOLN
INTRAMUSCULAR | Status: AC
  Filled 2016-04-19: qty 2

## 2016-04-19 MED ORDER — SODIUM CHLORIDE 0.9 % IV SOLN
INTRAVENOUS | Status: DC
  Administered 2016-04-19: 08:00:00 via INTRAVENOUS

## 2016-04-19 NOTE — H&P (Signed)
Patient ID: Lindsay Bentley, female    DOB: 1927-10-13    MRN: [id number]  HPI  61 yobf quit smoking 2014 due to to poorly healing ulcers on leg and limited then by legs with spells of difficult of breathing/ cough since late aug 2017 rx with nebs but with two episodes of hemoptysis > CT > PET ? Lesion in R Mainstem bronchus   04/18/2016 1st [place] [facility] office visit/ Galit Urich       Chief Complaint  Patient presents with  . Pulmonary Consult    Referred by Dr. Hosie Poisson. Pt c/o SOB x 6 months- occurs when she lies down and first thing in the am. She had hemoptysis a few times in Sept 2017 and was admitted to [facility]. She was dxed with PNA. She is coughing occ now- non prod.   breathing on R side down uncomfortable x sev months, does better on L  Last hemoptysis sev weeks prior to OV  And < tsp x sev episodes none since then   No obvious day to day or daytime variability or assoc excess/ purulent sputum or mucus plugs  or cp or chest tightness, subjective wheeze or overt sinus or hb symptoms. No unusual exp hx or h/o childhood pna/ asthma or knowledge of premature birth.  Sleeping ok when no on R side without nocturnal  or early am exacerbation  of respiratory  c/o's or need for noct saba. Also denies any obvious fluctuation of symptoms with weather or environmental changes or other aggravating or alleviating factors except as outlined above   Current Medications, Allergies, Complete Past Medical History, Past Surgical History, Family History, and Social History were reviewed in [employer] record.          Review of Systems  Constitutional: Negative for chills, fever and unexpected weight change.  HENT: Negative for congestion, dental problem, ear pain, nosebleeds, postnasal drip, rhinorrhea, sinus pressure, sneezing, sore throat, trouble swallowing and voice change.   Eyes: Negative for visual disturbance.  Respiratory: Positive  for cough and shortness of breath. Negative for choking.   Cardiovascular: Negative for chest pain and leg swelling.  Gastrointestinal: Negative for abdominal pain, diarrhea and vomiting.  Genitourinary: Negative for difficulty urinating.  Musculoskeletal: Negative for arthralgias.  Skin: Negative for rash.  Neurological: Negative for tremors, syncope and headaches.  Hematological: Does not bruise/bleed easily.       Objective:   Physical Exam  W/c bound pleasant elderly bf nad     Wt Readings from Last 3 Encounters:  05/03/15 154 lb (69.9 kg)  03/30/15 154 lb (69.9 kg)  03/17/15 154 lb (69.9 kg)    Vital signs reviewed - Note on arrival 02 sats  99% on RA     HEENT: nl dentition, turbinates, and oropharynx. Nl external ear canals without cough reflex   NECK :  without JVD/Nodes/TM/ nl carotid upstrokes bilaterally   LUNGS: no acc muscle use,  Nl contour chest with localized ins/exp rhonchi R of midline ant only   CV:  RRR  no s3 or murmur or increase in P2, no edema   ABD:  soft and nontender with nl inspiratory excursion in the supine position. No bruits or organomegaly, bowel sounds nl  MS:  Nl gait/ ext warm without deformities, calf tenderness, cyanosis or clubbing No obvious joint restrictions   SKIN: warm and dry without lesions    NEURO:  alert, approp, nl sensorium with  no motor deficits   PET  5/45/62  Hypermetabolic RMSB mass neg mets          Assessment & Plan:      Assessment & Plan Note by Tanda Rockers, MD at 04/18/2016 11:41 AM   Author: Tanda Rockers, MD Author Type: Physician Filed: 04/18/2016 11:43 AM  Note Status: Written Cosign: Cosign Not Required Encounter Date: 04/18/2016  Problem: Mass of right lung  Editor: Tanda Rockers, MD (Physician)    See PET 03/27/16 with RMSB mass/net mets and localized wheeze on exam > FOB 04/19/16    Most likely this is bronchogenic ca but late endobronchial met from breast  ca is also in ddx so def need tissue dx here.  Discussed in detail all the  indications, usual  risks and alternatives  relative to the benefits with patient/daughter who agree to proceed with bronchoscopy with biopsy  Total time devoted to counseling  = 35/89mreview case with pt and daughter including records from [place]/ imaging from there with "cartoon" graphic to demonstrate the problem / discussion of options/alternatives/ personally creating written instructions  in presence of pt  then going over those specific  Instructions directly with the pt       04/19/2016 Day of FOB:  No change hx or exam   MChristinia Gully MD [facility] [phone] 5:30 PM or weekends, call [phone]

## 2016-04-19 NOTE — Op Note (Signed)
Bronchoscopy Procedure Note  Date of Operation: 04/19/2016   Pre-op Diagnosis: lung mass  Post-op Diagnosis: exophytic mass RMSB > 75% obst   Surgeon: Christinia Gully  Anesthesia: Monitored Local Anesthesia with Sedation Time Started: 8:26 versed x 2.5 mg / demerol x 50 mg IV Time Stopped:  8:42   Operation: Video Flexible fiberoptic bronchoscopy, diagnostic   Findings: exophytic mass RMSB > 75% obst   Specimen: bal/ cytology only   Estimated Blood Loss: 25 cc   Complications: bled with mild lavage/ rx with epi x  1.5 vials  Indications and History: See updated H and P same date. The risks, benefits, complications, treatment options and expected outcomes were discussed with the patient.  The possibilities of reaction to medication, pulmonary aspiration, perforation of a viscus, bleeding, failure to diagnose a condition and creating a complication requiring transfusion or operation were discussed with the patient who freely signed the consent.    Description of Procedure: The patient was re-examined in the bronchoscopy suite and the site of surgery properly noted/marked.  The patient was identified  and the procedure verified as Flexible Fiberoptic Bronchoscopy.  A Time Out was held and the above information confirmed.   After the induction of topical nasopharyngeal anesthesia, the patient was positioned  and the bronchoscope was passed through the R naris. The vocal cords were visualized and  1% buffered lidocaine 5 ml was topically placed onto the cords. The cords were nl. The scope was then passed into the trachea.  1% buffered lidocaine given topically. Airways inspected bilaterally to the subsegmental level with the following findings:  Nl except for exophytic mass RMSB > 75% obst     Interventions: Bal/ epi x 2 not able to bx due to risk of bleeding /RMSB obst  Will refer to T surgery to consider laser resection/ permanently stop plavix in meatime      The Patient was  taken to the Endoscopy Recovery area in satisfactory condition.  Attestation: I performed the procedure.  Christinia Gully, MD [facility] and [place] [phone] After 5:30 PM or weekends, call [phone]

## 2016-04-19 NOTE — Progress Notes (Signed)
Video bronchoscopy performed Intervention bronchial washings  Kathie Dike RRT

## 2016-04-19 NOTE — Discharge Instructions (Signed)
Flexible Bronchoscopy, Care After These instructions give you information on caring for yourself after your procedure. Your doctor may also give you more specific instructions. Call your doctor if you have any problems or questions after your procedure. HOME CARE  Do not eat or drink anything for 2 hours after your procedure. If you try to eat or drink before the medicine wears off, food or drink could go into your lungs. You could also burn yourself.  After 2 hours have passed and when you can cough and gag normally, you may eat soft food and drink liquids slowly.  The day after the test, you may eat your normal diet.  You may do your normal activities.  Keep all doctor visits. GET HELP RIGHT AWAY IF:  You get more and more short of breath.  You get light-headed.  You feel like you are going to pass out (faint).  You have chest pain.  You have new problems that worry you.  You cough up more than a little blood.  You cough up more blood than before. MAKE SURE YOU:  Understand these instructions.  Will watch your condition.  Will get help right away if you are not doing well or get worse.   This information is not intended to replace advice given to you by your health care provider. Make sure you discuss any questions you have with your health care provider.   Document Released: 04/21/2009 Document Revised: 06/29/2013 Document Reviewed: 02/26/2013 Elsevier Interactive Patient Education 2016 [place] not eat or drink until after 1030 today 04/19/16

## 2016-04-22 ENCOUNTER — Encounter (HOSPITAL_COMMUNITY): Payer: Self-pay | Admitting: Internal Medicine

## 2016-04-29 ENCOUNTER — Encounter: Payer: Self-pay | Admitting: Thoracic Surgery (Cardiothoracic Vascular Surgery)

## 2016-04-29 ENCOUNTER — Institutional Professional Consult (permissible substitution) (INDEPENDENT_AMBULATORY_CARE_PROVIDER_SITE_OTHER): Payer: Medicare PPO | Admitting: Thoracic Surgery (Cardiothoracic Vascular Surgery)

## 2016-04-29 VITALS — BP 126/68 | HR 66 | Resp 16 | Ht 62.0 in | Wt 160.0 lb

## 2016-04-29 DIAGNOSIS — R918 Other nonspecific abnormal finding of lung field: Secondary | ICD-10-CM

## 2016-04-29 DIAGNOSIS — R042 Hemoptysis: Secondary | ICD-10-CM | POA: Diagnosis not present

## 2016-04-29 NOTE — Progress Notes (Signed)
PCP is Lillard Anes, MD Referring Provider is Tanda Rockers, MD.  Oncology- Hosie Poisson, MD  Chief Complaint  Patient presents with  . Lung Mass    RMSB per Dr. Lenise Herald 04/19/16.Marland KitchenMarland KitchenCT CHEST/PET @ [place] 03/20/16  . Hemoptysis    HPI: 82 year old woman sent for consideration of bronchoscopy and laser bronchoscopy.  Lindsay Bentley is an 82 year old woman with a past medical history significant for peripheral vascular disease with a right BKA and dry gangrene of the left foot, hypertension, hyperlipidemia, tobacco abuse (one pack a day until quitting 5 years ago), and non-insulin-dependent diabetes. She had pneumonia back in August. And her daughter says she's been short of breath since then. She has had 2 episodes of hemoptysis. A CT of the chest showed a question of a right mainstem bronchial lesion. There also was a right middle lobe groundglass opacity. PET/CT the lesion in the right mainstem bronchus was hypermetabolic. Dr. Melvyn Novas did bronchoscopy on 04/19/2016 and saw an exophytic mass in the right mainstem bronchus with approximately 75% obstruction. There was bleeding from the mass. Cytology showed atypical cells but was not definitively diagnostic. She has not had any further hemoptysis since her Plavix was discontinued  Zubrod Score: At the time of surgery this patient's most appropriate activity status/level should be described as: '[]'$     0    Normal activity, no symptoms '[]'$     1    Restricted in physical strenuous activity but ambulatory, able to do out light work '[x]'$     2    Ambulatory and capable of self care, unable to do work activities, up and about >50 % of waking hours                              '[]'$     3    Only limited self care, in bed greater than 50% of waking hours '[]'$     4    Completely disabled, no self care, confined to bed or chair '[]'$     5    Moribund  Past Medical History:  Diagnosis Date  . Constipation   . Gangrene of foot ([name])   .  Hyperglycemia    has Blood sugar checked twice a day  . Hyperlipemia   . Hypertension   . Peripheral vascular disease [facility])     Past Surgical History:  Procedure Laterality Date  . ABDOMINAL AORTAGRAM N/A 04/07/2013   Procedure: ABDOMINAL Maxcine Ham;  Surgeon: Conrad [place], MD;  Location: [facility] CATH LAB;  Service: Cardiovascular;  Laterality: N/A;  . AMPUTATION Right 05/03/2013   Procedure: AMPUTATION BELOW KNEE - RIGHT;  Surgeon: Conrad Cayuse, MD;  Location: [facility];  Service: Vascular;  Laterality: Right;  . JOINT REPLACEMENT    . MASTECTOMY Bilateral   . PERIPHERAL VASCULAR CATHETERIZATION N/A 03/30/2015   Procedure: Abdominal Aortogram;  Surgeon: Conrad Thorndale, MD;  Location: [place] CV LAB;  Service: Cardiovascular;  Laterality: N/A;  . TOTAL HIP ARTHROPLASTY Right   . VIDEO BRONCHOSCOPY Bilateral 04/19/2016   Procedure: VIDEO BRONCHOSCOPY WITHOUT FLUORO;  Surgeon: Tanda Rockers, MD;  Location: [facility] ENDOSCOPY;  Service: Cardiopulmonary;  Laterality: Bilateral;    Family History  Problem Relation Age of Onset  . Heart disease Mother   . Hypertension Mother   . Colon cancer Father   . Cancer Daughter     Social History Social History  Substance Use Topics  . Smoking status: Former Smoker  Packs/day: 0.50    Years: 50.00    Types: Cigarettes    Quit date: 04/08/2013  . Smokeless tobacco: Never Used  . Alcohol use No    Current Outpatient Prescriptions  Medication Sig Dispense Refill  . albuterol (PROVENTIL) (2.5 MG/3ML) 0.083% nebulizer solution Take 2.5 mg by nebulization every 6 (six) hours as needed for wheezing or shortness of breath.    . dexamethasone (DECADRON) 4 MG tablet Take 4 mg by mouth daily.    . metFORMIN (GLUCOPHAGE) 500 MG tablet 250 mg 2 (two) times daily.  3  . metoprolol (LOPRESSOR) 50 MG tablet Take 50 mg by mouth daily.    . pravastatin (PRAVACHOL) 40 MG tablet Take 40 mg by mouth daily.     . Prenatal Vit-Fe Fumarate-FA (M-VIT PO) Take 1 tablet by  mouth daily.     No current facility-administered medications for this visit.     No Known Allergies  Review of Systems  Constitutional: Positive for activity change. Negative for appetite change, chills, fever and unexpected weight change.  HENT: Negative for trouble swallowing and voice change.   Eyes: Positive for visual disturbance.  Respiratory: Positive for cough (2 episodes of hemoptysis), chest tightness, shortness of breath and wheezing (Mostly at night).   Cardiovascular: Positive for chest pain. Negative for leg swelling.       Peripheral arterial disease with right BKA and left foot gangrene. Claudication  Gastrointestinal: Negative for abdominal pain and blood in stool.  Genitourinary: Negative for difficulty urinating and dysuria.  Musculoskeletal: Positive for arthralgias and gait problem.  Neurological: Positive for dizziness.  Hematological: Negative for adenopathy. Does not bruise/bleed easily.  All other systems reviewed and are negative.   BP 126/68   Pulse 66   Resp 16   Ht '5\' 2"'$  (1.575 m)   Wt 160 lb (72.6 kg)   BMI 29.26 kg/m  Physical Exam  Constitutional: She is oriented to person, place, and time.  Elderly woman in no acute distress  HENT:  Head: Normocephalic and atraumatic.  Eyes: Conjunctivae and EOM are normal. No scleral icterus.  Neck: Neck supple. No thyromegaly present.  Cardiovascular: Normal rate, regular rhythm and normal heart sounds.   Pulmonary/Chest: Effort normal and breath sounds normal. No respiratory distress. She has no wheezes. She has no rales.  Abdominal: Soft. She exhibits no distension. There is no tenderness.  Musculoskeletal: She exhibits deformity (Right BKA).  Lymphadenopathy:    She has no cervical adenopathy.  Neurological: She is alert and oriented to person, place, and time. No cranial nerve deficit.  Skin: Skin is warm and dry.  Vitals reviewed.    Diagnostic Tests: Official report from CT chest 03/20/2016 at  [place] 1. No CT evidence of acute pulmonary embolus. 2. 10 mm right middle lobe pulmonary nodule. Primary bronchogenic neoplasm a distinct concern. PET/CT recommended to further evaluate. 3. 2.2 x 2.2 cm peripheral wedge-shaped area of groundglass and interstitial opacity in the right upper lobe is nonspecific. This may be related to infectious/inflammatory process although scarring or even neoplasm could have this appearance. Attention on follow-up recommended. This can be further assessed at the time of PET/CT. 4. Emphysema 5. Coronary artery atherosclerosis. Electronically signed Misty Stanley, M.D.  I personally reviewed the CT chest and concur with the findings noted above  Impression: 82 year old woman with a mass in the right mainstem bronchus that is likely a primary bronchogenic carcinoma. She had 2 episodes of hemoptysis, but has not had any further  hemoptysis since stopping clopidogrel.  I discussed the option of bronchoscopy for biopsy, debulking, and possible laser ablation under general anesthesia with Mrs. Takayama and her daughter. Her daughter seems to be the primary decision maker. We discussed the indications, risks, benefits, and alternatives. We discussed the outcomes. Although I think this would be relatively safe to do, if she were to start bleeding intraoperatively and cannot be controlled with the laser, but will likely be a fatal event, as she is not a candidate for surgical rescue. Also of note is that she is having chest pain and shortness of breath on fairly regular basis and so would be at risk with general anesthesia of having a heart attack, stroke or some other catastrophic event. I think the likelihood of catastrophic complications relatively low but the possibility cannot be ruled out.  Another consideration is that without or definitive treatment debridement and laser ablation is only a very short term fix and she is likely to reocclude the  bronchus in a very short period of time.  Mrs. Oshima's daughter is reluctant to go ahead with bronchoscopy under general anesthesia and would like to talk to radiation oncology about whether it would be possible to radiate her without a definitive diagnosis.  I did ask them to call me if she has any additional bleeding.   I will be happy to do the bronchoscopy if they decide to proceed.    Melrose Nakayama, MD [facility] [phone]

## 2016-05-02 ENCOUNTER — Inpatient Hospital Stay: Admission: RE | Admit: 2016-05-02 | Payer: Self-pay | Source: Ambulatory Visit

## 2016-05-02 ENCOUNTER — Other Ambulatory Visit: Payer: Self-pay | Admitting: Radiation Oncology

## 2016-05-02 DIAGNOSIS — C801 Malignant (primary) neoplasm, unspecified: Secondary | ICD-10-CM

## 2016-05-02 NOTE — Progress Notes (Signed)
Thoracic Location of Tumor / Histology: Right middle lobe pulmonary nodule,right mainstem bronchus  Patient presented  months ago with symptoms of: Shortness breath,  Biopsies of  (if applicable) revealed: 83/29/19 Cytology: Diagnosis BRONCHIAL LAVAGE, RMSB (SPECIMEN 1 OF 1 COLLECTED 04/19/16): ATYPICAL CELLS PRESENT, .  Tobacco/Marijuana/Snuff/ETOH use: 1 ppd 50 years quit 2014,no alcohol or smokeless tobacco use  Past/Anticipated interventions by cardiothoracic surgery, if any: Bronchoscopy  04/19/16 Dr. Wert=exophttic mass  In right mainstem bronchis  With 75% obstruction.C  Consulted note from  Dr. Modesto Charon, MD 04/29/16  Past/Anticipated interventions by medical oncology, if any: remote history Breast bilateral  Cancer/with mastectomies, no appt with Med Onc   Signs/Symptoms  Weight changes, if any: NO  Respiratory complaints, if any:Yes shortness breath mild exertion  Hemoptysis, if any: None since plavix d/c'd  Pain issues, if any:  no SAFETY ISSUES:YES, Right BKA  Prior radiation? NO  Pacemaker/ICD?  NO  Possible current pregnancy?N/A  Is the patient on methotrexate? NO  Current Complaints / other details:   Widowed, 1 daughter, Completely disabled, 1 daughter  PVD,HTN, NIDDM,gangrene left foot, emphysema, Daughter also breast cancer Lumpectomy and  Radiation  Chemotherapy in [place], patient  Father colon cancer,   Allergies: PCNS =remote BP (!) 143/68 (BP Location: Left Arm, Patient Position: Sitting, Cuff Size: Normal)   Pulse 77   Temp 98.9 F (37.2 C) (Oral)   Resp 20   Ht '5\' 2"'$  (1.575 m)   Wt 157 lb (71.2 kg) Comment: with prosthetic leg  SpO2 100%   BMI 28.72 kg/m   Wt Readings from Last 3 Encounters:  05/06/16 157 lb (71.2 kg)  04/29/16 160 lb (72.6 kg)  05/03/15 154 lb (69.9 kg)

## 2016-05-06 ENCOUNTER — Ambulatory Visit
Admission: RE | Admit: 2016-05-06 | Discharge: 2016-05-06 | Disposition: A | Discharge status: Home / Self Care | Payer: Medicare PPO | Source: Ambulatory Visit | Attending: Radiation Oncology | Admitting: Radiation Oncology

## 2016-05-06 ENCOUNTER — Encounter: Payer: Self-pay | Admitting: Radiation Oncology

## 2016-05-06 VITALS — BP 143/68 | HR 77 | Temp 98.9°F | Resp 20 | Ht 62.0 in | Wt 157.0 lb

## 2016-05-06 DIAGNOSIS — R918 Other nonspecific abnormal finding of lung field: Secondary | ICD-10-CM

## 2016-05-06 DIAGNOSIS — C3401 Malignant neoplasm of right main bronchus: Secondary | ICD-10-CM | POA: Insufficient documentation

## 2016-05-06 DIAGNOSIS — Z510 Encounter for antineoplastic radiation therapy: Secondary | ICD-10-CM | POA: Insufficient documentation

## 2016-05-06 NOTE — Progress Notes (Addendum)
Radiation Oncology         [phone] ________________________________  Name: Lindsay Bentley MRN: [id number]  Date: 05/06/2016  DOB: 10/16/1927  QI:[id number],VZDGLOVF EDWARD, MD  Derwood Kaplan, MD     REFERRING PHYSICIAN: Derwood Kaplan, MD   DIAGNOSIS: The primary encounter diagnosis was Malignant neoplasm of right main bronchus [facility]). A diagnosis of Mass of right lung was also pertinent to this visit.   Malignant neoplasm of right main bronchus (HCC)   Staging form: Lung, AJCC 7th Edition   - Clinical: Stage IIB (T3, N0, M0) - Signed by Kyung Rudd, MD on 05/07/2016   Clinically patient appears to be presenting with a NSCLC, most likely squamous cell cancer  HISTORY OF PRESENT ILLNESS::Lindsay Bentley is a 82 y.o. female who is seen for an initial consultation visit.  She presented to the ED at [facility] on 03/20/16 for a 6 month history of shortness of breath and a 1 weeks history of hemoptysis. Chest x-ray showed mild cardiomegaly, no confluent airspace opacities or effusions, no acute bony abnormality, and no overt edema. CT angiogram of the chest showed a 4.3 cm ascending thoracic aortic aneurysm, a 1.0 cm right middle lobe pulmonary nodule, and a 2.2 x 2.2 cm peripheral wedge-shaped area of ground-glass and interstitial attenuation in the right upper lobe. The CT showed no mediastinal, hilar, or axillary lymphadenopathy. She was admitted to the hospital and discharged with pneumonia.  PET scan on 03/27/16 showed a 1.0 cm nodule in the right middle lobe with no significant hypermetabolism, a right upper lobe ground-glass opacity demonstrating low-level non malignant range hypermetabolism measuring 2.3 cm and a SUV max of 1.1, and a hypermetabolic lesion within the distal right mainstem bronchus (at the origin of the right upper lobe bronchus and origination of the bronchus intermedius) measuring 1.9 cm and a SUV max of 21.3.  On 04/19/16, the patient presented to Dr. Melvyn Novas  to undergo a bronchoscopy with biopsy. Dr. Melvyn Novas noted that the exophytic mass in the RMSB with approximately greater than 75% obstruction. There was bleeding from the mass with blood loss of about 25 cc. Cytology revealed atypical cells consisting of atypical squamous epithelium.  The patient presented to Dr. Roxan Hockey on 04/29/16 who discussed the option of bronchoscopy for biopsy, debulking, and possible laser ablation under general anesthesia. Although he believes think this would be relatively safe to do, if she were to start bleeding intraoperatively and cannot be controlled with the laser it would likely be a fatal event as she is not a candidate for surgical rescue. Also of note is that she is having chest pain and shortness of breath on fairly regular basis and so would be at risk with general anesthesia of having a heart attack, stroke, or some other catastrophic event. He believes the likelihood of catastrophic complications is relatively low, but the possibility cannot be ruled out. Another consideration is that without definitive treatment, debridement and laser ablation is only a very short term fix and she is likely to reocclude the bronchus in a very short period of time.  The patient was reluctant to go ahead with bronchoscopy under general anesthesia and would like to speak with radiation oncology today about whether it would be possible to radiate her without a definitive diagnosis. Also present today is the patient's daughter.  PREVIOUS RADIATION THERAPY: No   PAST MEDICAL HISTORY:  has a past medical history of Constipation; Gangrene of foot ([place]); Hyperglycemia; Hyperlipemia; Hypertension; and Peripheral vascular  disease ([place]).     PAST SURGICAL HISTORY: Past Surgical History:  Procedure Laterality Date  . ABDOMINAL AORTAGRAM N/A 04/07/2013   Procedure: ABDOMINAL Maxcine Ham;  Surgeon: Conrad Trent Woods, MD;  Location: [facility] CATH LAB;  Service: Cardiovascular;  Laterality: N/A;  .  AMPUTATION Right 05/03/2013   Procedure: AMPUTATION BELOW KNEE - RIGHT;  Surgeon: Conrad West Union, MD;  Location: [facility];  Service: Vascular;  Laterality: Right;  . JOINT REPLACEMENT    . MASTECTOMY Bilateral   . PERIPHERAL VASCULAR CATHETERIZATION N/A 03/30/2015   Procedure: Abdominal Aortogram;  Surgeon: Conrad Alpine, MD;  Location: [facility] CV LAB;  Service: Cardiovascular;  Laterality: N/A;  . TOTAL HIP ARTHROPLASTY Right   . VIDEO BRONCHOSCOPY Bilateral 04/19/2016   Procedure: VIDEO BRONCHOSCOPY WITHOUT FLUORO;  Surgeon: Tanda Rockers, MD;  Location: [facility] ENDOSCOPY;  Service: Cardiopulmonary;  Laterality: Bilateral;     FAMILY HISTORY: family history includes Cancer in her daughter; Colon cancer in her father; Heart disease in her mother; Hypertension in her mother.   SOCIAL HISTORY:  reports that she quit smoking about 3 years ago. Her smoking use included Cigarettes. She has a 25.00 pack-year smoking history. She has never used smokeless tobacco. She reports that she does not drink alcohol or use drugs.   ALLERGIES: Penicillins   MEDICATIONS:  Current Outpatient Prescriptions  Medication Sig Dispense Refill  . albuterol (PROVENTIL) (2.5 MG/3ML) 0.083% nebulizer solution Take 2.5 mg by nebulization every 6 (six) hours as needed for wheezing or shortness of breath.    . dexamethasone (DECADRON) 4 MG tablet Take 4 mg by mouth daily.    . metFORMIN (GLUCOPHAGE) 500 MG tablet 250 mg 2 (two) times daily.  3  . metoprolol (LOPRESSOR) 50 MG tablet Take 50 mg by mouth daily.    . pravastatin (PRAVACHOL) 40 MG tablet Take 40 mg by mouth daily.     . Prenatal Vit-Fe Fumarate-FA (M-VIT PO) Take 1 tablet by mouth daily.    Marland Kitchen atenolol (TENORMIN) 50 MG tablet Take 50 mg by mouth daily.     No current facility-administered medications for this encounter.      REVIEW OF SYSTEMS:  A 15 point review of systems is documented in the electronic medical record. This was obtained by the nursing  staff. However, I reviewed this with the patient to discuss relevant findings and make appropriate changes.  Pertinent items noted in HPI and remainder of comprehensive ROS otherwise negative.   She reports shortness of breath with mild exertion. She denies hemoptysis since Plavix was discontinued.  PHYSICAL EXAM:  height is '5\' 2"'$  (1.575 m) and weight is 157 lb (71.2 kg). Her oral temperature is 98.9 F (37.2 C). Her blood pressure is 143/68 (abnormal) and her pulse is 77. Her respiration is 20 and oxygen saturation is 100%.   General: Alert and oriented, in no acute distress HEENT: Head is normocephalic. Extraocular movements are intact. Oropharynx is clear. Neck: Neck is supple, no palpable cervical or supraclavicular lymphadenopathy. Heart: Regular in rate and rhythm with no murmurs, rubs, or gallops. Chest: Clear to auscultation bilaterally, with no rhonchi, wheezes, or rales. Abdomen: Soft, nontender, nondistended, with no rigidity or guarding. Extremities: No cyanosis or edema. Right below the knee amputee. Lymphatics: see Neck Exam Skin: No concerning lesions. Musculoskeletal: symmetric strength and muscle tone throughout. The patient presents in a wheelchair. Neurologic: Cranial nerves II through XII are grossly intact. No obvious focalities. Speech is fluent. Coordination is intact. Psychiatric: Judgment and  insight are intact. Affect is appropriate.  ECOG = 2  0 - Asymptomatic (Fully active, able to carry on all predisease activities without restriction)  1 - Symptomatic but completely ambulatory (Restricted in physically strenuous activity but ambulatory and able to carry out work of a light or sedentary nature. For example, light housework, office work)  2 - Symptomatic, <50% in bed during the day (Ambulatory and capable of all self care but unable to carry out any work activities. Up and about more than 50% of waking hours)  3 - Symptomatic, >50% in bed, but not bedbound  (Capable of only limited self-care, confined to bed or chair 50% or more of waking hours)  4 - Bedbound (Completely disabled. Cannot carry on any self-care. Totally confined to bed or chair)  5 - Death   Eustace Pen MM, Creech RH, Tormey DC, et al. [phone]). "Toxicity and response criteria of the [facility]". [place] Oncol. 5 (6): 649-55    LABORATORY DATA:  Lab Results  Component Value Date   WBC 6.7 05/05/2013   HGB 10.2 (L) 03/30/2015   HCT 30.0 (L) 03/30/2015   MCV 91.2 05/05/2013   PLT 196 05/05/2013   Lab Results  Component Value Date   NA 140 03/30/2015   K 4.7 03/30/2015   CL 105 03/30/2015   CO2 25 05/05/2013   Lab Results  Component Value Date   ALT 8 04/03/2013   AST 16 04/03/2013   ALKPHOS 63 04/03/2013   BILITOT 0.4 04/03/2013      RADIOGRAPHY: No results found.     IMPRESSION: Clinically patient appears to be presenting with a NSCLC, most likely squamous cell cancer  Even though the biopsy did not confirm cancer, the exophytic mass in the right mainstem bronchus behaves and appears to be cancer. The tumor is markedly hypermetabolic. We discussed that it is not ideal to have the suspicion of a diagnosis of cancer without a clear pathologic evidence, but the patient is not interested in obtaining additional tissue due to the risk of bleeding. Radiation treatment could be a form of treatment if she is not a candidate for debulking with bronchoscopy or the patient refuses surgery. The patient is a high risk candidate for surgery. At this time, the patient is apprehensive about additional surgery. I discussed that in many respects I believe bronchoscopy and directed debulking of the tumor would be more favorable, the but the patient does not wish to proceed with any invasive treatments given the risk of significant toxicity.  We discussed her diagnosis and stage. We discussed definitive radiation in the management of her disease. We discussed 5-6  weeks of treatment as an outpatient. We discussed the process of CT simulation and the placement of tattoos. We discussed dysphagia, weight loss and fatigue as the acute side effects of radiation. We discussed damage to critical normal structures in the chest as well as the spinal cord as possible but improbably.  PLAN: The patient would like to proceed with radiation therapy to the right main stem bronchus. The patient's daughter signed a consent form and a copy was placed in the patient's medical chart. CT simulation is [facility] on 05/08/16 at [facility]. Based on the patient's wishes and goals of treatment, they wish to proceed with a more definitive course of radiation treatment. I would like to treat this lesion in a hypo-fractionated manner, potentially to 60 gray in 24 fractions. This is analogous to the CALGB study but given the proximity  of the tumor to the patient's esophagus I would like to lower the dose modestly. The patient and her family will further think about this. If they wish to proceed with a more palliative course of radiation treatment, then we can shorten her treatment potentially to 2 weeks. At this time based on their comments today, we will plan for a longer course of treatment to try to achieve more durable local control.     ________________________________   Jodelle Gross, MD, PhD  This document serves as a record of services personally performed by Kyung Rudd, MD. It was created on his behalf by Darcus Austin, a trained medical scribe. The creation of this record is based on the scribe's personal observations and the provider's statements to them. This document has been checked and approved by the attending provider.

## 2016-05-06 NOTE — Progress Notes (Signed)
Please see the Nurse Progress Note in the MD Initial Consult Encounter for this patient. 

## 2016-05-07 DIAGNOSIS — C3401 Malignant neoplasm of right main bronchus: Secondary | ICD-10-CM | POA: Insufficient documentation

## 2016-05-07 NOTE — Addendum Note (Signed)
Encounter addended by: Kyung Rudd, MD on: 05/07/2016  8:35 AM<BR>    Actions taken: Problem List modified, Sign clinical note

## 2016-05-08 ENCOUNTER — Ambulatory Visit
Admission: RE | Admit: 2016-05-08 | Discharge: 2016-05-08 | Disposition: A | Discharge status: Home / Self Care | Payer: Medicare PPO | Source: Ambulatory Visit | Attending: Radiation Oncology | Admitting: Radiation Oncology

## 2016-05-08 DIAGNOSIS — C3401 Malignant neoplasm of right main bronchus: Secondary | ICD-10-CM

## 2016-05-08 DIAGNOSIS — Z510 Encounter for antineoplastic radiation therapy: Secondary | ICD-10-CM | POA: Diagnosis not present

## 2016-05-15 DIAGNOSIS — Z510 Encounter for antineoplastic radiation therapy: Secondary | ICD-10-CM | POA: Diagnosis not present

## 2016-05-16 ENCOUNTER — Encounter: Payer: Self-pay | Admitting: *Deleted

## 2016-05-16 NOTE — Progress Notes (Signed)
[place] Psychosocial Distress Screening Clinical Social Work  Clinical Social Work was referred by distress screening protocol.  The patient scored a 7 on the Psychosocial Distress Thermometer which indicates severe distress. Clinical Social Worker phoned pt to assess for distress and other psychosocial needs. CSW spoke with daughter via phone and she reports family has strong support network of family and church friends. Daughter thinks family can help with transportation currently. Daughter feels pt is coping well at this time. CSW discussed possible options as a back up and reviewed resources of CSW team/Support Programs. Daughter agrees to reach out as needed.   ONCBCN DISTRESS SCREENING 05/06/2016  Screening Type Initial Screening  Distress experienced in past week (1-10) 7  Emotional problem type Nervousness/Anxiety;Adjusting to illness  Information Concerns Type Lack of info about diagnosis;Lack of info about treatment  Physical Problem type Getting around;Breathing;Constipation/diarrhea  Physician notified of physical symptoms Yes  Referral to clinical social work Yes     Clinical Social Worker follow up needed: No.  If yes, follow up plan:  Loren Racer, [place]  [facility] Phone: [phone] Fax: [phone]

## 2016-05-20 ENCOUNTER — Ambulatory Visit
Admission: RE | Admit: 2016-05-20 | Discharge: 2016-05-20 | Disposition: A | Discharge status: Home / Self Care | Payer: Medicare PPO | Source: Ambulatory Visit | Attending: Radiation Oncology | Admitting: Radiation Oncology

## 2016-05-20 DIAGNOSIS — Z510 Encounter for antineoplastic radiation therapy: Secondary | ICD-10-CM | POA: Diagnosis not present

## 2016-05-21 ENCOUNTER — Ambulatory Visit
Admission: RE | Admit: 2016-05-21 | Discharge: 2016-05-21 | Disposition: A | Discharge status: Home / Self Care | Payer: Medicare PPO | Source: Ambulatory Visit | Attending: Radiation Oncology | Admitting: Radiation Oncology

## 2016-05-21 DIAGNOSIS — Z510 Encounter for antineoplastic radiation therapy: Secondary | ICD-10-CM | POA: Diagnosis not present

## 2016-05-22 ENCOUNTER — Ambulatory Visit
Admission: RE | Admit: 2016-05-22 | Discharge: 2016-05-22 | Disposition: A | Discharge status: Home / Self Care | Payer: Medicare PPO | Source: Ambulatory Visit | Attending: Radiation Oncology | Admitting: Radiation Oncology

## 2016-05-22 VITALS — BP 144/84 | HR 56 | Resp 18 | Wt 153.0 lb

## 2016-05-22 DIAGNOSIS — Z510 Encounter for antineoplastic radiation therapy: Secondary | ICD-10-CM | POA: Diagnosis not present

## 2016-05-22 DIAGNOSIS — C3401 Malignant neoplasm of right main bronchus: Secondary | ICD-10-CM

## 2016-05-22 NOTE — Progress Notes (Signed)
  Radiation Oncology         [phone] ________________________________  Name: Lindsay Bentley MRN: [id number]  Date: 05/08/2016  DOB: May 20, 1928  SIMULATION AND TREATMENT PLANNING NOTE  DIAGNOSIS:     ICD-9-CM ICD-10-CM   1. Malignant neoplasm of right main bronchus (HCC) 162.2 C34.01      Site:  Right lung/mediastinum  NARRATIVE:  The patient was brought to the [facility].  Identity was confirmed.  All relevant records and images related to the planned course of therapy were reviewed.   Written consent to proceed with treatment was confirmed which was freely given after reviewing the details related to the planned course of therapy had been reviewed with the patient.  Then, the patient was set-up in a stable reproducible  supine position for radiation therapy.  CT images were obtained.  Surface markings were placed.    Medically necessary complex treatment device(s) for immobilization:  Not applicable.   The CT images were loaded into the planning software.  Then the target and avoidance structures were contoured.  Treatment planning then occurred.  The radiation prescription was entered and confirmed.  I have requested : Intensity Modulated Radiotherapy (IMRT) is medically necessary for this case for the following reason:  Sparing of critical adjacent normal structures including the spinal cord and also the esophagus.   The patient will undergo daily image guidance to ensure accurate localization of the target, and adequate minimize dose to the normal surrounding structures in close proximity to the target.   PLAN:  The patient will receive 60 Gy in 24 fractions in a hypo-fractionated manner. The patient will not receive concurrent chemotherapy..  ________________________________   Jodelle Gross, MD, PhD

## 2016-05-22 NOTE — Progress Notes (Addendum)
Vitals stable. Progressive weight loss noted. Patient reports her appetite is great. Denies nausea, vomiting, or diarrhea. Denies pain. Reports an intermittent dry cough. Denies hemoptysis. Denies pain or difficulty associated with swallowing. Reports SOB with exertion. No skin changes within treatment field noted. Requested patient bring in a list of her current medications tomorrow so that her med list can be updated. Patient verbalized understanding.   BP (!) 144/84 (BP Location: Left Arm, Patient Position: Sitting, Cuff Size: Normal)   Pulse (!) 56   Resp 18   Wt 153 lb (69.4 kg)   SpO2 100%   BMI 27.98 kg/m  Wt Readings from Last 3 Encounters:  05/22/16 153 lb (69.4 kg)  05/06/16 157 lb (71.2 kg)  04/29/16 160 lb (72.6 kg)

## 2016-05-22 NOTE — Progress Notes (Signed)
   Department of Radiation Oncology  Phone:  [phone] Fax:        [phone]  Weekly Treatment Note    Name: Lindsay Bentley Date: 05/22/2016 MRN: [id number] DOB: 21-May-1928   Diagnosis:     ICD-9-CM ICD-10-CM   1. Malignant neoplasm of right main bronchus (HCC) 162.2 C34.01      Current dose: 7.5 Gy  Current fraction:3    MEDICATIONS: Current Outpatient Prescriptions  Medication Sig Dispense Refill  . albuterol (PROVENTIL) (2.5 MG/3ML) 0.083% nebulizer solution Take 2.5 mg by nebulization every 6 (six) hours as needed for wheezing or shortness of breath.    . ALPRAZolam (XANAX) 0.25 MG tablet     . atenolol (TENORMIN) 50 MG tablet Take 50 mg by mouth daily.    . metFORMIN (GLUCOPHAGE) 500 MG tablet 250 mg 2 (two) times daily.  3  . metoprolol (LOPRESSOR) 50 MG tablet Take 50 mg by mouth daily.    . pravastatin (PRAVACHOL) 40 MG tablet Take 40 mg by mouth daily.     . Prenatal Vit-Fe Fumarate-FA (M-VIT PO) Take 1 tablet by mouth daily.    Marland Kitchen dexamethasone (DECADRON) 4 MG tablet Take 4 mg by mouth daily.     No current facility-administered medications for this encounter.      ALLERGIES: Penicillins   LABORATORY DATA:  Lab Results  Component Value Date   WBC 6.7 05/05/2013   HGB 10.2 (L) 03/30/2015   HCT 30.0 (L) 03/30/2015   MCV 91.2 05/05/2013   PLT 196 05/05/2013   Lab Results  Component Value Date   NA 140 03/30/2015   K 4.7 03/30/2015   CL 105 03/30/2015   CO2 25 05/05/2013   Lab Results  Component Value Date   ALT 8 04/03/2013   AST 16 04/03/2013   ALKPHOS 63 04/03/2013   BILITOT 0.4 04/03/2013     NARRATIVE: Lindsay Bentley was seen today for weekly treatment management. The chart was checked and the patient's films were reviewed.  Vitals stable. Progressive weight loss noted. The patient reports her appetite is good. She denies nausea, vomiting, or diarrhea. Denies pain. The patient reports an intermittent dry cough. Denies hemoptysis.  Denies pain or difficulty associated with swallowing. Reports SOB with exertion. Per nursing, no skin changes in the treatment field noted.  PHYSICAL EXAMINATION: weight is 153 lb (69.4 kg). Her blood pressure is 144/84 (abnormal) and her pulse is 56 (abnormal). Her respiration is 18 and oxygen saturation is 100%.    In general this is a well appearing Caucasian female in no acute distress. She's alert and oriented x4 and appropriate throughout the examination. Cardiopulmonary assessment is negative for acute distress and she exhibits normal effort.   ASSESSMENT: The patient is doing satisfactorily with treatment. Patient presents in a wheelchair today.  PLAN: We will continue with the patient's radiation treatment as planned.  ------------------------------------------------  Jodelle Gross, MD, PhD     This document serves as a record of services personally performed by Kyung Rudd, MD. It was created on his behalf by Maryla Morrow, a trained medical scribe. The creation of this record is based on the scribe's personal observations and the provider's statements to them. This document has been checked and approved by the attending provider.

## 2016-05-23 ENCOUNTER — Ambulatory Visit
Admission: RE | Admit: 2016-05-23 | Discharge: 2016-05-23 | Disposition: A | Discharge status: Home / Self Care | Payer: Medicare PPO | Source: Ambulatory Visit | Attending: Radiation Oncology | Admitting: Radiation Oncology

## 2016-05-23 DIAGNOSIS — Z510 Encounter for antineoplastic radiation therapy: Secondary | ICD-10-CM | POA: Diagnosis not present

## 2016-05-23 NOTE — Progress Notes (Signed)
Update patient's medications to reflect what she is taking at home.

## 2016-05-24 ENCOUNTER — Inpatient Hospital Stay
Admission: RE | Admit: 2016-05-24 | Discharge: 2016-05-24 | Disposition: A | Discharge status: Home / Self Care | Payer: Self-pay | Source: Ambulatory Visit | Attending: Radiation Oncology | Admitting: Radiation Oncology

## 2016-05-24 ENCOUNTER — Ambulatory Visit
Admission: RE | Admit: 2016-05-24 | Discharge: 2016-05-24 | Disposition: A | Discharge status: Home / Self Care | Payer: Medicare PPO | Source: Ambulatory Visit | Attending: Radiation Oncology | Admitting: Radiation Oncology

## 2016-05-24 DIAGNOSIS — Z510 Encounter for antineoplastic radiation therapy: Secondary | ICD-10-CM | POA: Diagnosis not present

## 2016-05-24 NOTE — Progress Notes (Signed)
Pt and family wants to be seen by Dr. Lisbeth Renshaw.  Pt reports she fell yesterday, did not get hurt or hit head.  She hasn't been taken her medication since Tuesday.  Reports she didn't feel like it. Dr. Lisbeth Renshaw in to see patient.

## 2016-05-26 ENCOUNTER — Ambulatory Visit
Admission: RE | Admit: 2016-05-26 | Discharge: 2016-05-26 | Disposition: A | Discharge status: Home / Self Care | Payer: Medicare PPO | Source: Ambulatory Visit | Attending: Radiation Oncology | Admitting: Radiation Oncology

## 2016-05-26 DIAGNOSIS — Z510 Encounter for antineoplastic radiation therapy: Secondary | ICD-10-CM | POA: Diagnosis not present

## 2016-05-27 ENCOUNTER — Ambulatory Visit
Admission: RE | Admit: 2016-05-27 | Discharge: 2016-05-27 | Disposition: A | Discharge status: Home / Self Care | Payer: Medicare PPO | Source: Ambulatory Visit | Attending: Radiation Oncology | Admitting: Radiation Oncology

## 2016-05-27 DIAGNOSIS — Z510 Encounter for antineoplastic radiation therapy: Secondary | ICD-10-CM | POA: Diagnosis not present

## 2016-05-28 ENCOUNTER — Ambulatory Visit
Admission: RE | Admit: 2016-05-28 | Discharge: 2016-05-28 | Disposition: A | Discharge status: Home / Self Care | Payer: Medicare PPO | Source: Ambulatory Visit | Attending: Radiation Oncology | Admitting: Radiation Oncology

## 2016-05-28 DIAGNOSIS — Z510 Encounter for antineoplastic radiation therapy: Secondary | ICD-10-CM | POA: Diagnosis not present

## 2016-05-29 ENCOUNTER — Ambulatory Visit
Admission: RE | Admit: 2016-05-29 | Discharge: 2016-05-29 | Disposition: A | Discharge status: Home / Self Care | Payer: Medicare PPO | Source: Ambulatory Visit | Attending: Radiation Oncology | Admitting: Radiation Oncology

## 2016-05-29 DIAGNOSIS — Z510 Encounter for antineoplastic radiation therapy: Secondary | ICD-10-CM | POA: Diagnosis not present

## 2016-05-31 ENCOUNTER — Ambulatory Visit: Payer: Medicare PPO

## 2016-06-03 ENCOUNTER — Encounter: Payer: Self-pay | Admitting: Radiation Oncology

## 2016-06-03 ENCOUNTER — Ambulatory Visit
Admission: RE | Admit: 2016-06-03 | Discharge: 2016-06-03 | Disposition: A | Discharge status: Home / Self Care | Payer: Medicare PPO | Source: Ambulatory Visit | Attending: Radiation Oncology | Admitting: Radiation Oncology

## 2016-06-03 VITALS — BP 162/63 | HR 87 | Temp 97.7°F | Resp 16 | Wt 151.6 lb

## 2016-06-03 DIAGNOSIS — C3401 Malignant neoplasm of right main bronchus: Secondary | ICD-10-CM

## 2016-06-03 DIAGNOSIS — Z510 Encounter for antineoplastic radiation therapy: Secondary | ICD-10-CM | POA: Diagnosis not present

## 2016-06-03 MED ORDER — RADIAPLEXRX EX GEL
Freq: Once | CUTANEOUS | Status: AC
  Administered 2016-06-03: 09:00:00 via TOPICAL

## 2016-06-03 NOTE — Progress Notes (Signed)
   Department of Radiation Oncology  Phone:  [phone] Fax:        [phone]  Weekly Treatment Note    Name: Lindsay Bentley Date: 06/03/2016 MRN: [id number] DOB: February 21, 1928   Diagnosis:     ICD-9-CM ICD-10-CM   1. Malignant neoplasm of right main bronchus (HCC) 162.2 C34.01 hyaluronate sodium (RADIAPLEXRX) gel     Current dose: 25 Gy  Current fraction: 10   MEDICATIONS: Current Outpatient Prescriptions  Medication Sig Dispense Refill  . albuterol (PROVENTIL) (2.5 MG/3ML) 0.083% nebulizer solution Take 2.5 mg by nebulization every 6 (six) hours as needed for wheezing or shortness of breath.    . ALPRAZolam (XANAX) 0.25 MG tablet Take 0.25 mg by mouth. 1/2-1 tablet as needed    . dexamethasone (DECADRON) 4 MG tablet Take 4 mg by mouth daily.    . metFORMIN (GLUCOPHAGE) 500 MG tablet 250 mg 2 (two) times daily.  3  . metoprolol (LOPRESSOR) 50 MG tablet Take 50 mg by mouth daily.    . Multiple Vitamins-Minerals (CENTRUM SILVER PO) Take by mouth.    . pravastatin (PRAVACHOL) 40 MG tablet Take 40 mg by mouth daily.     . Wound Cleansers (RADIAPLEX EX) Apply topically.     No current facility-administered medications for this encounter.      ALLERGIES: Penicillins   LABORATORY DATA:  Lab Results  Component Value Date   WBC 6.7 05/05/2013   HGB 10.2 (L) 03/30/2015   HCT 30.0 (L) 03/30/2015   MCV 91.2 05/05/2013   PLT 196 05/05/2013   Lab Results  Component Value Date   NA 140 03/30/2015   K 4.7 03/30/2015   CL 105 03/30/2015   CO2 25 05/05/2013   Lab Results  Component Value Date   ALT 8 04/03/2013   AST 16 04/03/2013   ALKPHOS 63 04/03/2013   BILITOT 0.4 04/03/2013     NARRATIVE: Lindsay Bentley was seen today for weekly treatment management. The chart was checked and the patient's films were reviewed.  Vitals stable. Weight loss noted; the patient reports her appetite is great. Denies nausea, vomiting, or diarrhea. Denies pain. The patient reports an  intermittent dry cough. Denies hemoptysis. She denies pain or difficulty with swallowing. The patient reports shortness of breath with exertion. Per nursing, no skin changes within the treatment field noted.  PHYSICAL EXAMINATION: weight is 151 lb 9.6 oz (68.8 kg). Her oral temperature is 97.7 F (36.5 C). Her blood pressure is 162/63 (abnormal) and her pulse is 87. Her respiration is 16 and oxygen saturation is 100%.    In general this is a well appearing Caucasian female in no acute distress. She's alert and oriented x4 and appropriate throughout the examination. Cardiopulmonary assessment is negative for acute distress and she exhibits normal effort.   ASSESSMENT: The patient is doing satisfactorily with treatment. Patient presents in a wheelchair today.  PLAN: We will continue with the patient's radiation treatment as planned.  ------------------------------------------------  Jodelle Gross, MD, PhD    This document serves as a record of services personally performed by Kyung Rudd, MD. It was created on his behalf by Maryla Morrow, a trained medical scribe. The creation of this record is based on the scribe's personal observations and the provider's statements to them. This document has been checked and approved by the attending provider.

## 2016-06-03 NOTE — Progress Notes (Signed)
Vitals stable. Weight loss noted. Patient reports her appetite is great. Denies nausea, vomiting, or diarrhea. Denies pain. Reports an intermittent dry cough. Denies hemoptysis. Denies pain or difficulty associated with swallowing. Reports SOB with exertion. No skin changes within treatment field noted. Provided patient with Radiaplex and directed upon use. Patient verbalized understanding  BP (!) 162/63 (BP Location: Right Arm, Patient Position: Sitting, Cuff Size: Normal)   Pulse 87   Temp 97.7 F (36.5 C) (Oral)   Resp 16   Wt 151 lb 9.6 oz (68.8 kg)   SpO2 100%   BMI 27.73 kg/m  Wt Readings from Last 3 Encounters:  06/03/16 151 lb 9.6 oz (68.8 kg)  05/24/16 163 lb 3.2 oz (74 kg)  05/22/16 153 lb (69.4 kg)

## 2016-06-04 ENCOUNTER — Ambulatory Visit
Admission: RE | Admit: 2016-06-04 | Discharge: 2016-06-04 | Disposition: A | Discharge status: Home / Self Care | Payer: Medicare PPO | Source: Ambulatory Visit | Attending: Radiation Oncology | Admitting: Radiation Oncology

## 2016-06-04 DIAGNOSIS — Z510 Encounter for antineoplastic radiation therapy: Secondary | ICD-10-CM | POA: Diagnosis not present

## 2016-06-05 ENCOUNTER — Ambulatory Visit
Admission: RE | Admit: 2016-06-05 | Discharge: 2016-06-05 | Disposition: A | Discharge status: Home / Self Care | Payer: Medicare PPO | Source: Ambulatory Visit | Attending: Radiation Oncology | Admitting: Radiation Oncology

## 2016-06-05 DIAGNOSIS — Z510 Encounter for antineoplastic radiation therapy: Secondary | ICD-10-CM | POA: Diagnosis not present

## 2016-06-06 ENCOUNTER — Ambulatory Visit
Admission: RE | Admit: 2016-06-06 | Discharge: 2016-06-06 | Disposition: A | Discharge status: Home / Self Care | Payer: Medicare PPO | Source: Ambulatory Visit | Attending: Radiation Oncology | Admitting: Radiation Oncology

## 2016-06-06 DIAGNOSIS — Z510 Encounter for antineoplastic radiation therapy: Secondary | ICD-10-CM | POA: Diagnosis not present

## 2016-06-07 ENCOUNTER — Ambulatory Visit
Admission: RE | Admit: 2016-06-07 | Discharge: 2016-06-07 | Disposition: A | Discharge status: Home / Self Care | Payer: Medicare PPO | Source: Ambulatory Visit | Attending: Radiation Oncology | Admitting: Radiation Oncology

## 2016-06-07 DIAGNOSIS — Z510 Encounter for antineoplastic radiation therapy: Secondary | ICD-10-CM | POA: Diagnosis not present

## 2016-06-10 ENCOUNTER — Ambulatory Visit
Admission: RE | Admit: 2016-06-10 | Discharge: 2016-06-10 | Disposition: A | Discharge status: Home / Self Care | Payer: Medicare PPO | Source: Ambulatory Visit | Attending: Radiation Oncology | Admitting: Radiation Oncology

## 2016-06-10 ENCOUNTER — Encounter: Payer: Self-pay | Admitting: Radiation Oncology

## 2016-06-10 VITALS — BP 162/65 | HR 54 | Temp 97.8°F | Resp 20 | Wt 156.2 lb

## 2016-06-10 DIAGNOSIS — C3401 Malignant neoplasm of right main bronchus: Secondary | ICD-10-CM

## 2016-06-10 DIAGNOSIS — Z510 Encounter for antineoplastic radiation therapy: Secondary | ICD-10-CM | POA: Diagnosis not present

## 2016-06-10 NOTE — Progress Notes (Signed)
Weekly rad txs right lung, 15/24 completd, no skin changes, uasing sonafine daily, no difficulty swallowing,  No c/o pain, has dry cough, room air 100% , no c/o appetite good, mild fatigue 9:38 AM BP (!) 162/65 (BP Location: Right Arm, Patient Position: Sitting, Cuff Size: Normal)   Pulse (!) 54   Temp 97.8 F (36.6 C) (Oral)   Resp 20   Wt 156 lb 3.2 oz (70.9 kg)   SpO2 100% Comment: room air  BMI 28.57 kg/m   Wt Readings from Last 3 Encounters:  06/10/16 156 lb 3.2 oz (70.9 kg)  06/03/16 151 lb 9.6 oz (68.8 kg)  05/24/16 163 lb 3.2 oz (74 kg)

## 2016-06-10 NOTE — Progress Notes (Signed)
   Department of Radiation Oncology  Phone:  [phone] Fax:        [phone]  Weekly Treatment Note    Name: Lindsay Bentley Date: 06/10/2016 MRN: [id number] DOB: May 29, 1928   Diagnosis:     ICD-9-CM ICD-10-CM   1. Malignant neoplasm of right main bronchus (HCC) 162.2 C34.01      Current dose: 37.5 Gy  Current fraction: 15   MEDICATIONS: Current Outpatient Prescriptions  Medication Sig Dispense Refill  . albuterol (PROVENTIL) (2.5 MG/3ML) 0.083% nebulizer solution Take 2.5 mg by nebulization every 6 (six) hours as needed for wheezing or shortness of breath.    . ALPRAZolam (XANAX) 0.25 MG tablet Take 0.25 mg by mouth. 1/2-1 tablet as needed    . dexamethasone (DECADRON) 4 MG tablet Take 4 mg by mouth daily.    . metFORMIN (GLUCOPHAGE) 500 MG tablet 250 mg 2 (two) times daily.  3  . metoprolol (LOPRESSOR) 50 MG tablet Take 50 mg by mouth daily.    . Multiple Vitamins-Minerals (CENTRUM SILVER PO) Take by mouth.    . pravastatin (PRAVACHOL) 40 MG tablet Take 40 mg by mouth daily.     . Wound Cleansers (RADIAPLEX EX) Apply topically.     No current facility-administered medications for this encounter.      ALLERGIES: Penicillins   LABORATORY DATA:  Lab Results  Component Value Date   WBC 6.7 05/05/2013   HGB 10.2 (L) 03/30/2015   HCT 30.0 (L) 03/30/2015   MCV 91.2 05/05/2013   PLT 196 05/05/2013   Lab Results  Component Value Date   NA 140 03/30/2015   K 4.7 03/30/2015   CL 105 03/30/2015   CO2 25 05/05/2013   Lab Results  Component Value Date   ALT 8 04/03/2013   AST 16 04/03/2013   ALKPHOS 63 04/03/2013   BILITOT 0.4 04/03/2013     NARRATIVE: Lindsay Bentley was seen today for weekly treatment management. The chart was checked and the patient's films were reviewed.  Patient denies difficulty swallowing, pain, or skin changes. She reports a dry cough. She reports a good appetite and mild fatigue.  PHYSICAL EXAMINATION: weight is 156 lb 3.2 oz (70.9  kg). Her oral temperature is 97.8 F (36.6 C). Her blood pressure is 162/65 (abnormal) and her pulse is 54 (abnormal). Her respiration is 20 and oxygen saturation is 100%.     ASSESSMENT: The patient is doing satisfactorily with treatment. Patient presents in a wheelchair today.  PLAN: We will continue with the patient's radiation treatment as planned.  ------------------------------------------------  Jodelle Gross, MD, PhD    This document serves as a record of services personally performed by Kyung Rudd, MD. It was created on his behalf by Bethann Humble, a trained medical scribe. The creation of this record is based on the scribe's personal observations and the provider's statements to them. This document has been checked and approved by the attending provider.

## 2016-06-11 ENCOUNTER — Ambulatory Visit
Admission: RE | Admit: 2016-06-11 | Discharge: 2016-06-11 | Disposition: A | Discharge status: Home / Self Care | Payer: Medicare PPO | Source: Ambulatory Visit | Attending: Radiation Oncology | Admitting: Radiation Oncology

## 2016-06-11 DIAGNOSIS — Z510 Encounter for antineoplastic radiation therapy: Secondary | ICD-10-CM | POA: Diagnosis not present

## 2016-06-12 ENCOUNTER — Ambulatory Visit
Admission: RE | Admit: 2016-06-12 | Discharge: 2016-06-12 | Disposition: A | Discharge status: Home / Self Care | Payer: Medicare PPO | Source: Ambulatory Visit | Attending: Radiation Oncology | Admitting: Radiation Oncology

## 2016-06-12 DIAGNOSIS — Z510 Encounter for antineoplastic radiation therapy: Secondary | ICD-10-CM | POA: Diagnosis not present

## 2016-06-13 ENCOUNTER — Ambulatory Visit
Admission: RE | Admit: 2016-06-13 | Discharge: 2016-06-13 | Disposition: A | Discharge status: Home / Self Care | Payer: Medicare PPO | Source: Ambulatory Visit | Attending: Radiation Oncology | Admitting: Radiation Oncology

## 2016-06-13 DIAGNOSIS — Z510 Encounter for antineoplastic radiation therapy: Secondary | ICD-10-CM | POA: Diagnosis not present

## 2016-06-14 ENCOUNTER — Encounter: Payer: Self-pay | Admitting: Radiation Oncology

## 2016-06-14 ENCOUNTER — Ambulatory Visit
Admission: RE | Admit: 2016-06-14 | Discharge: 2016-06-14 | Disposition: A | Discharge status: Home / Self Care | Payer: Medicare PPO | Source: Ambulatory Visit | Attending: Radiation Oncology | Admitting: Radiation Oncology

## 2016-06-14 VITALS — BP 169/67 | HR 62 | Temp 98.5°F | Resp 20 | Wt 158.0 lb

## 2016-06-14 DIAGNOSIS — Z510 Encounter for antineoplastic radiation therapy: Secondary | ICD-10-CM | POA: Diagnosis not present

## 2016-06-14 DIAGNOSIS — C3401 Malignant neoplasm of right main bronchus: Secondary | ICD-10-CM

## 2016-06-14 NOTE — Progress Notes (Signed)
Weekly rad txs 19/24  Right lung completed, mild erythema on back, none on front chest, using radiaplex daily, no c/o coughing, sob, pain, no difficulty swallowing, yesterday scratchy none today, appetite good 8:20 AM BP (!) 169/67 (BP Location: Left Arm, Patient Position: Sitting, Cuff Size: Normal)   Pulse 62   Temp 98.5 F (36.9 C) (Oral)   Resp 20   Wt 158 lb (71.7 kg)   SpO2 100% Comment: room air  BMI 28.90 kg/m   Wt Readings from Last 3 Encounters:  06/14/16 158 lb (71.7 kg)  06/10/16 156 lb 3.2 oz (70.9 kg)  06/03/16 151 lb 9.6 oz (68.8 kg)

## 2016-06-14 NOTE — Progress Notes (Signed)
   Department of Radiation Oncology  Phone:  [phone] Fax:        [phone]  Weekly Treatment Note    Name: Lindsay Bentley Date: 06/14/2016 MRN: [id number] DOB: August 09, 1927   Diagnosis:     ICD-9-CM ICD-10-CM   1. Malignant neoplasm of right main bronchus (HCC) 162.2 C34.01      Current dose: 47.5 Gy  Current fraction: 19   MEDICATIONS: Current Outpatient Prescriptions  Medication Sig Dispense Refill  . albuterol (PROVENTIL) (2.5 MG/3ML) 0.083% nebulizer solution Take 2.5 mg by nebulization every 6 (six) hours as needed for wheezing or shortness of breath.    . ALPRAZolam (XANAX) 0.25 MG tablet Take 0.25 mg by mouth. 1/2-1 tablet as needed    . dexamethasone (DECADRON) 4 MG tablet Take 4 mg by mouth daily.    . metFORMIN (GLUCOPHAGE) 500 MG tablet 250 mg 2 (two) times daily.  3  . metoprolol (LOPRESSOR) 50 MG tablet Take 50 mg by mouth daily.    . Multiple Vitamins-Minerals (CENTRUM SILVER PO) Take by mouth.    . pravastatin (PRAVACHOL) 40 MG tablet Take 40 mg by mouth daily.     . Wound Cleansers (RADIAPLEX EX) Apply topically.     No current facility-administered medications for this encounter.      ALLERGIES: Penicillins   LABORATORY DATA:  Lab Results  Component Value Date   WBC 6.7 05/05/2013   HGB 10.2 (L) 03/30/2015   HCT 30.0 (L) 03/30/2015   MCV 91.2 05/05/2013   PLT 196 05/05/2013   Lab Results  Component Value Date   NA 140 03/30/2015   K 4.7 03/30/2015   CL 105 03/30/2015   CO2 25 05/05/2013   Lab Results  Component Value Date   ALT 8 04/03/2013   AST 16 04/03/2013   ALKPHOS 63 04/03/2013   BILITOT 0.4 04/03/2013     NARRATIVE: Lindsay Bentley was seen today for weekly treatment management. The chart was checked and the patient's films were reviewed.  Weekly rad txs 19/24  Right lung completed, mild erythema on back, none on front chest, using radiaplex daily, no c/o coughing, sob, pain, no difficulty swallowing, yesterday scratchy  none today, appetite good 9:20 AM BP (!) 169/67 (BP Location: Left Arm, Patient Position: Sitting, Cuff Size: Normal)   Pulse 62   Temp 98.5 F (36.9 C) (Oral)   Resp 20   Wt 158 lb (71.7 kg)   SpO2 100% Comment: room air  BMI 28.90 kg/m   Wt Readings from Last 3 Encounters:  06/14/16 158 lb (71.7 kg)  06/10/16 156 lb 3.2 oz (70.9 kg)  06/03/16 151 lb 9.6 oz (68.8 kg)    PHYSICAL EXAMINATION: weight is 158 lb (71.7 kg). Her oral temperature is 98.5 F (36.9 C). Her blood pressure is 169/67 (abnormal) and her pulse is 62. Her respiration is 20 and oxygen saturation is 100%.        ASSESSMENT: The patient is doing satisfactorily with treatment.  PLAN: We will continue with the patient's radiation treatment as planned.

## 2016-06-17 ENCOUNTER — Ambulatory Visit
Admission: RE | Admit: 2016-06-17 | Discharge: 2016-06-17 | Disposition: A | Discharge status: Home / Self Care | Payer: Medicare PPO | Source: Ambulatory Visit | Attending: Radiation Oncology | Admitting: Radiation Oncology

## 2016-06-17 DIAGNOSIS — Z510 Encounter for antineoplastic radiation therapy: Secondary | ICD-10-CM | POA: Diagnosis not present

## 2016-06-18 ENCOUNTER — Ambulatory Visit
Admission: RE | Admit: 2016-06-18 | Discharge: 2016-06-18 | Disposition: A | Discharge status: Home / Self Care | Payer: Medicare PPO | Source: Ambulatory Visit | Attending: Radiation Oncology | Admitting: Radiation Oncology

## 2016-06-18 DIAGNOSIS — Z510 Encounter for antineoplastic radiation therapy: Secondary | ICD-10-CM | POA: Diagnosis not present

## 2016-06-19 ENCOUNTER — Ambulatory Visit
Admission: RE | Admit: 2016-06-19 | Discharge: 2016-06-19 | Disposition: A | Discharge status: Home / Self Care | Payer: Medicare PPO | Source: Ambulatory Visit | Attending: Radiation Oncology | Admitting: Radiation Oncology

## 2016-06-19 DIAGNOSIS — Z510 Encounter for antineoplastic radiation therapy: Secondary | ICD-10-CM | POA: Diagnosis not present

## 2016-06-20 ENCOUNTER — Encounter: Payer: Self-pay | Admitting: Radiation Oncology

## 2016-06-20 ENCOUNTER — Ambulatory Visit
Admission: RE | Admit: 2016-06-20 | Discharge: 2016-06-20 | Disposition: A | Discharge status: Home / Self Care | Payer: Medicare PPO | Source: Ambulatory Visit | Attending: Radiation Oncology | Admitting: Radiation Oncology

## 2016-06-20 VITALS — BP 154/69 | HR 79 | Temp 98.2°F | Ht 62.0 in | Wt 158.8 lb

## 2016-06-20 DIAGNOSIS — C3401 Malignant neoplasm of right main bronchus: Secondary | ICD-10-CM

## 2016-06-20 DIAGNOSIS — Z510 Encounter for antineoplastic radiation therapy: Secondary | ICD-10-CM | POA: Diagnosis not present

## 2016-06-20 NOTE — Progress Notes (Signed)
Ms. Stonehouse presents for her 23rd fraction of radiation to her Right Lung. She denies pain. She does have some fatigue after a long day. She denies shortness of breath. She has an occasional non-productive cough. She denies burning or pain with swallowing, and is eating well per her report. She does report a "scratchy feeling" at times in her chest. She has some slight redness to her radiation site, and continues to use Radiaplex twice daily.  She was given an one month follow up appointment today.   BP (!) 154/69   Pulse 79   Temp 98.2 F (36.8 C)   Ht '5\' 2"'$  (1.575 m)   Wt 158 lb 12.8 oz (72 kg)   SpO2 97% Comment: room air  BMI 29.04 kg/m    Wt Readings from Last 3 Encounters:  06/20/16 158 lb 12.8 oz (72 kg)  06/14/16 158 lb (71.7 kg)  06/10/16 156 lb 3.2 oz (70.9 kg)

## 2016-06-20 NOTE — Progress Notes (Signed)
Department of Radiation Oncology  Phone:  [phone] Fax:        [phone]  Weekly Treatment Note    Name: Lindsay Bentley Date: 06/20/2016 MRN: [id number] DOB: 1928/05/20   Diagnosis:     ICD-9-CM ICD-10-CM   1. Malignant neoplasm of right main bronchus (HCC) 162.2 C34.01      Current dose: 57.5 Gy  Current fraction: 23   MEDICATIONS: Current Outpatient Prescriptions  Medication Sig Dispense Refill  . albuterol (PROVENTIL) (2.5 MG/3ML) 0.083% nebulizer solution Take 2.5 mg by nebulization every 6 (six) hours as needed for wheezing or shortness of breath.    . ALPRAZolam (XANAX) 0.25 MG tablet Take 0.25 mg by mouth. 1/2-1 tablet as needed    . dexamethasone (DECADRON) 4 MG tablet Take 4 mg by mouth daily.    . metFORMIN (GLUCOPHAGE) 500 MG tablet 250 mg 2 (two) times daily.  3  . metoprolol (LOPRESSOR) 50 MG tablet Take 50 mg by mouth daily.    . Multiple Vitamins-Minerals (CENTRUM SILVER PO) Take by mouth.    . pravastatin (PRAVACHOL) 40 MG tablet Take 40 mg by mouth daily.     . Wound Cleansers (RADIAPLEX EX) Apply topically.     No current facility-administered medications for this encounter.      ALLERGIES: Penicillins   LABORATORY DATA:  Lab Results  Component Value Date   WBC 6.7 05/05/2013   HGB 10.2 (L) 03/30/2015   HCT 30.0 (L) 03/30/2015   MCV 91.2 05/05/2013   PLT 196 05/05/2013   Lab Results  Component Value Date   NA 140 03/30/2015   K 4.7 03/30/2015   CL 105 03/30/2015   CO2 25 05/05/2013   Lab Results  Component Value Date   ALT 8 04/03/2013   AST 16 04/03/2013   ALKPHOS 63 04/03/2013   BILITOT 0.4 04/03/2013     NARRATIVE: Lindsay Bentley was seen today for weekly treatment management. The chart was checked and the patient's films were reviewed.  Lindsay Bentley presents for her 23rd fraction of radiation to her Right Lung. She denies pain. She does have some fatigue after a long day. She denies shortness of breath and does not use her  breathing machine anymore. She has an occasional non-productive cough. She denies burning or pain with swallowing, and is eating well per her report. She denies heartburn. She does report a "scratchy feeling" at times in her chest. Nursing notes, some slight redness to her radiation site, and advised continue to use Radiaplex twice daily.  She was given an one month follow up appointment today. She is not sure when she will see Dr. Hinton Rao next. I spoke with the patient's daughter by phone during our visit.   PHYSICAL EXAMINATION: height is '5\' 2"'$  (1.575 m) and weight is 158 lb 12.8 oz (72 kg). Her temperature is 98.2 F (36.8 C). Her blood pressure is 154/69 (abnormal) and her pulse is 79. Her oxygen saturation is 97%.      In wheelchair. Alert and in no acute distress.  ASSESSMENT: The patient is doing satisfactorily with treatment.  PLAN: We will continue with the patient's radiation treatment as planned. She is scheduled to complete treatment tomorrow and will return for follow up in 1 month. At this next visit, we will check if Dr. Hinton Rao has ordered repeat scans, if not we can order these.    ------------------------------------------------  Jodelle Gross, MD, PhD  This document serves as a record of services personally  performed by Kyung Rudd, MD. It was created on his behalf by Arlyce Harman, a trained medical scribe. The creation of this record is based on the scribe's personal observations and the provider's statements to them. This document has been checked and approved by the attending provider.

## 2016-06-21 ENCOUNTER — Encounter: Payer: Self-pay | Admitting: Radiation Oncology

## 2016-06-21 ENCOUNTER — Ambulatory Visit
Admission: RE | Admit: 2016-06-21 | Discharge: 2016-06-21 | Disposition: A | Discharge status: Home / Self Care | Payer: Medicare PPO | Source: Ambulatory Visit | Attending: Radiation Oncology | Admitting: Radiation Oncology

## 2016-06-21 DIAGNOSIS — Z510 Encounter for antineoplastic radiation therapy: Secondary | ICD-10-CM | POA: Diagnosis not present

## 2016-07-18 NOTE — Progress Notes (Signed)
°  Radiation Oncology         [phone] ________________________________  Name: Lindsay Bentley MRN: [id number]  Date: 06/21/2016  DOB: [birth year]  End of Treatment Note  Diagnosis:   Clinically patient appears to be presenting with a NSCLC, most likely squamous cell cancer       ICD-9-CM ICD-10-CM   1. Malignant neoplasm of right main bronchus ([place]) 162.2 C34.01     Indication for treatment:  Curative       Radiation treatment dates:   05/20/2016 to 06/21/2016  Site/dose:   The Right lung was treated to 60 Gy in 24 fractions at 2.5 Gy per fraction.   Beams/energy:   IMRT // 6X  Narrative: The patient tolerated radiation treatment relatively well.  She reports fatigue, an occasional non-productive cough, and a "scratchy feeling" at times in her chest. She denies burning or pain with swallowing, and is eating well per her report.   Plan: The patient has completed radiation treatment. The patient will return to radiation oncology clinic for routine followup in one month. I advised them to call or return sooner if they have any questions or concerns related to their recovery or treatment.  ------------------------------------------------  Jodelle Gross, MD, PhD  This document serves as a record of services personally performed by Kyung Rudd, MD. It was created on his behalf by Arlyce Harman, a trained medical scribe. The creation of this record is based on the scribe's personal observations and the provider's statements to them. This document has been checked and approved by the attending provider.

## 2016-08-12 NOTE — Progress Notes (Addendum)
Mrs. Fidela Cieslak. Bubb 83 y.o. Woman with Malignant neoplasm of right main bronchus radiation completed 06-21-16 one none FU.  Weight changes, if any: Wt Readings from Last 3 Encounters:  08/20/16 161 lb 3.2 oz (73.1 kg)  06/20/16 158 lb 12.8 oz (72 kg)  06/14/16 158 lb (71.7 kg)   Respiratory complaints, if any: Occassional wheezing Hemoptysis, if any: None Swallowing Problems/Pain/Difficulty swallowing:None Smoking Tobacco/Marijuana/Snuff/ETOH use: Former smoker quit 04-08-13 x 50 years 1/2 p/d no alcohol or drug usuage Appetite :[employer] three meals a day and snacks Pain:None When is next chemo scheduled?:None Lab work from of chart:None Imaging:None BP (!) 153/99   Pulse 60   Temp 98.8 F (37.1 C) (Oral)   Resp 18   Ht '5\' 2"'$  (1.575 m)   Wt 161 lb 3.2 oz (73.1 kg)   SpO2 100%   BMI 29.48 kg/m

## 2016-08-20 ENCOUNTER — Ambulatory Visit
Admission: RE | Admit: 2016-08-20 | Discharge: 2016-08-20 | Disposition: A | Discharge status: Home / Self Care | Payer: Medicare PPO | Source: Ambulatory Visit | Attending: Radiation Oncology | Admitting: Radiation Oncology

## 2016-08-20 ENCOUNTER — Encounter: Payer: Self-pay | Admitting: Radiation Oncology

## 2016-08-20 VITALS — BP 153/99 | HR 60 | Temp 98.8°F | Resp 18 | Ht 62.0 in | Wt 161.2 lb

## 2016-08-20 DIAGNOSIS — R600 Localized edema: Secondary | ICD-10-CM | POA: Diagnosis not present

## 2016-08-20 DIAGNOSIS — R413 Other amnesia: Secondary | ICD-10-CM | POA: Insufficient documentation

## 2016-08-20 DIAGNOSIS — C3490 Malignant neoplasm of unspecified part of unspecified bronchus or lung: Secondary | ICD-10-CM | POA: Diagnosis not present

## 2016-08-20 DIAGNOSIS — Z923 Personal history of irradiation: Secondary | ICD-10-CM | POA: Insufficient documentation

## 2016-08-20 DIAGNOSIS — Z5189 Encounter for other specified aftercare: Secondary | ICD-10-CM | POA: Diagnosis present

## 2016-08-20 DIAGNOSIS — Z79899 Other long term (current) drug therapy: Secondary | ICD-10-CM | POA: Diagnosis not present

## 2016-08-20 DIAGNOSIS — Z880 Allergy status to penicillin: Secondary | ICD-10-CM | POA: Insufficient documentation

## 2016-08-20 DIAGNOSIS — C3401 Malignant neoplasm of right main bronchus: Secondary | ICD-10-CM

## 2016-08-20 DIAGNOSIS — Z7984 Long term (current) use of oral hypoglycemic drugs: Secondary | ICD-10-CM | POA: Diagnosis not present

## 2016-08-20 NOTE — Progress Notes (Signed)
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   Interval Since Last Radiation:  8 weeks.   Radiation treatment dates:05/20/2016 to 06/21/2016: The Right lung was treated to 60 Gy in 24 fractions at 2.5 Gy per fraction.    Narrative:  The patient returns today for routine follow-up.  She presents today with her daughter who contributes to the visit appropriately.  Ms. Radloff reports that she has recovered from the effects of radiotherapy very well and is currently without complaints.  During her treatment, she did note mild fatigue as well as an occasional non-productive cough and scratchy feeling in her chest which have all resolved.    On review of systems, the patient states that she feels well in general.   Currently, she denies shortness of breath, CP, palpitations, fever, chills, cough or difficulty swallowing.  She reports a healthy appetite and energy has improved.  Her daughter reports concern regarding recent intermittent memory loss noted over the past 4-6 weeks.  This has not been evaluated to date.  Her daughter reports that she has "good days and bad days".  On the bad days, she seems disoriented and forgetful and has difficulty with small tasks such as dressing herself. On the good days, she seems perfectly fine.  An additional concern reported by her daughter is swelling in the upper and lower extremities on the left.  The swelling has been noted for the past 4 weeks and does not seem to be improving.  The swelling is improved in the mornings and worsens throughout the day. She denies associated pain or paresthesias.  She does have a history of HTN, hyperlipidemia and PVD but has not had recent evaluation with cardiology or  vascular.                  ALLERGIES:  is allergic to penicillins.  Meds: Current Outpatient Prescriptions  Medication Sig Dispense Refill  . ALPRAZolam (XANAX) 0.25 MG tablet Take 0.25 mg by mouth. 1/2-1 tablet as needed    . metFORMIN (GLUCOPHAGE) 500 MG tablet 250 mg 2 (two) times daily.  3  . metoprolol (LOPRESSOR) 50 MG tablet Take 50 mg by mouth daily.    . Multiple Vitamins-Minerals (CENTRUM SILVER PO) Take by mouth.    . pravastatin (PRAVACHOL) 40 MG tablet Take 40 mg by mouth daily.     Marland Kitchen albuterol (PROVENTIL) (2.5 MG/3ML) 0.083% nebulizer solution Take 2.5 mg by nebulization every 6 (six) hours as needed for wheezing or shortness of breath.    . dexamethasone (DECADRON) 4 MG tablet Take 4 mg by mouth daily.     No current facility-administered medications for this encounter.     Physical Findings:  height is '5\' 2"'$  (1.575 m) and weight is 161 lb 3.2 oz (73.1 kg). Her oral temperature is 98.8 F (37.1 C). Her blood pressure is 153/99 (abnormal) and her pulse is 60. Her respiration is 18 and oxygen saturation is 100%.  Pain Assessment Pain Score: 0-No pain/10 In general this is a well appearing African American female in no acute distress. She's alert and oriented x4 and appropriate throughout the examination. Cardiopulmonary assessment is negative for acute distress and she exhibits normal effort. Normal Rate and Rhythm without  murmur/gallops/rubs. She has normal breath sounds bilaterally without wheezing, rhonchi or rales. Right hand is mildly edematous but does not extend above the wrist and there is no associated erythema.  Left upper extremity is without edema or erythema. She is s/p AKA on the right lower extremity.  Left lower extremity demonstrates 2+ pitting edema without erythema or calf tenderness. Sensation is intact bilaterally in the upper and lower extremities.  Grip strength is equal bilaterally.  Lab Findings: Lab Results  Component Value Date   WBC 6.7 05/05/2013    HGB 10.2 (L) 03/30/2015   HCT 30.0 (L) 03/30/2015   MCV 91.2 05/05/2013   PLT 196 05/05/2013     Radiographic Findings: No results found.  Impression/Plan: 1. 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 The patient appears to be doing well following radiotherapy.  We will proceed with scheduling a CT scan of the chest with contrast and BMP to make sure that she can receive contrast prior to her CT scan in the next 1-2 weeks. I will follow up with her daughter by phone with these results, and provided that this scan is stable, we will move forward with serial imaging at six-month intervals until 5 years, at which point in time, she will have annual low-dose CT scan. She lives in [place] and has a well established relationship with Dr. Hinton Rao, whom they wish to transfer her follow up care to.  Her daughter works for [employer] and has already spoken with Dr. Hinton Rao whom has agreed with this plan.  She will plan follow up with Dr. Hinton Rao in 6 months time to review the findings from the follow up CT scan which will be ordered provided that the scan ordered today is stable.  2. New onset memory loss. In light of the fact that she has some recent mental status changes and she  has not had MRI of the brain previously, we will proceed with ordering an MRI Brain with and without contrast for further disease staging.  I will plan to call her daughter by phone to discuss these results.  3. Upper and lower extremity edema, bilateral. Recommend follow up with her cardiologist at [facility] for further evaluation and management of intermittent upper and lower extremity swelling.  We will make a referral on her behalf.  Nicholos Johns, PA-C

## 2016-08-22 ENCOUNTER — Telehealth: Payer: Self-pay | Admitting: *Deleted

## 2016-08-22 NOTE — Telephone Encounter (Signed)
Called patient to inform of Ct on 08-26-16 - arrival time- 2:30 pm @ [facility] (Radiology) and labs to be drawn prior to test , pt. To be NPO - 4 hrs. Prior to test, spoke with patient's daughter- Vella Kohler and she is aware of these appts.

## 2016-08-23 ENCOUNTER — Telehealth: Payer: Self-pay | Admitting: *Deleted

## 2016-08-23 NOTE — Telephone Encounter (Signed)
CALLED PATIENT TO INFORM OF MRI ON 08-27-16 - ARRIVAL TIME - 11:15 AM @ [place] , SPOKE [place] IS AWARE OF THIS TEST

## 2016-08-27 ENCOUNTER — Encounter: Payer: Self-pay | Admitting: Radiation Oncology

## 2016-08-27 ENCOUNTER — Telehealth: Payer: Self-pay | Admitting: *Deleted

## 2016-08-27 NOTE — Progress Notes (Signed)
I spoke with Dr. Hinton Rao and she is aware of the PE and new pulmonary nodules on recent post treatment CT. She will see the patient today to start anticoagulation.

## 2016-08-27 NOTE — Telephone Encounter (Signed)
CALLED PATIENT'S DAUGHTER - Lindsay Bentley TO INFORM OF HER MOM (Lindsay Bentley) APPT. WITH DR. Geraldo Pitter ON 09-12-16 - ARRIVAL TIME - 3 PM, ADDRESS [place], [place].[address] - [phone], SPOKE WITH PATIENT'S DAUGHTER Lindsay Bentley AND SHE IS AWARE OF THIS APPT.

## 2016-08-28 DIAGNOSIS — C3491 Malignant neoplasm of unspecified part of right bronchus or lung: Secondary | ICD-10-CM | POA: Diagnosis not present

## 2016-08-28 DIAGNOSIS — I2699 Other pulmonary embolism without acute cor pulmonale: Secondary | ICD-10-CM

## 2016-08-28 DIAGNOSIS — R918 Other nonspecific abnormal finding of lung field: Secondary | ICD-10-CM | POA: Diagnosis not present

## 2016-08-30 ENCOUNTER — Telehealth: Payer: Self-pay | Admitting: Urology

## 2016-08-30 NOTE — Telephone Encounter (Signed)
Spoke with patient's daughter, Vella Kohler to inform her of the results of the recent MRI Brain for her mother, Lindsay Bentley.  Advised that there was no evidence of acute infarct or intracranial metastatic disease.  Her mother will continue her follow up for lung cancer, going forward, with Dr. Hinton Rao in [place] as this is much more convenient for them.  I advised that we are happy to help at any time and they are welcome to call or contact us at any time with questions or concerns regarding her previous radiotherapy.  Nicholos Johns, PA-C

## 2016-09-25 DIAGNOSIS — C7800 Secondary malignant neoplasm of unspecified lung: Secondary | ICD-10-CM

## 2016-09-25 DIAGNOSIS — D649 Anemia, unspecified: Secondary | ICD-10-CM | POA: Diagnosis not present

## 2016-09-25 DIAGNOSIS — I2699 Other pulmonary embolism without acute cor pulmonale: Secondary | ICD-10-CM | POA: Diagnosis not present

## 2016-09-25 DIAGNOSIS — C3491 Malignant neoplasm of unspecified part of right bronchus or lung: Secondary | ICD-10-CM

## 2016-11-06 DIAGNOSIS — D649 Anemia, unspecified: Secondary | ICD-10-CM | POA: Diagnosis not present

## 2016-11-06 DIAGNOSIS — C3490 Malignant neoplasm of unspecified part of unspecified bronchus or lung: Secondary | ICD-10-CM

## 2016-11-06 DIAGNOSIS — C7800 Secondary malignant neoplasm of unspecified lung: Secondary | ICD-10-CM | POA: Diagnosis not present

## 2016-11-06 DIAGNOSIS — Z7901 Long term (current) use of anticoagulants: Secondary | ICD-10-CM | POA: Diagnosis not present

## 2016-11-06 DIAGNOSIS — Z86711 Personal history of pulmonary embolism: Secondary | ICD-10-CM

## 2017-01-29 DIAGNOSIS — C3490 Malignant neoplasm of unspecified part of unspecified bronchus or lung: Secondary | ICD-10-CM | POA: Diagnosis not present

## 2017-01-29 DIAGNOSIS — C7800 Secondary malignant neoplasm of unspecified lung: Secondary | ICD-10-CM | POA: Diagnosis not present

## 2017-01-29 DIAGNOSIS — B373 Candidiasis of vulva and vagina: Secondary | ICD-10-CM | POA: Diagnosis not present

## 2017-01-29 DIAGNOSIS — F0390 Unspecified dementia without behavioral disturbance: Secondary | ICD-10-CM | POA: Diagnosis not present

## 2017-01-29 DIAGNOSIS — I2699 Other pulmonary embolism without acute cor pulmonale: Secondary | ICD-10-CM | POA: Diagnosis not present

## 2017-01-29 DIAGNOSIS — R1900 Intra-abdominal and pelvic swelling, mass and lump, unspecified site: Secondary | ICD-10-CM | POA: Diagnosis not present

## 2017-01-29 DIAGNOSIS — D649 Anemia, unspecified: Secondary | ICD-10-CM | POA: Diagnosis not present

## 2017-03-25 DIAGNOSIS — R410 Disorientation, unspecified: Secondary | ICD-10-CM | POA: Diagnosis not present

## 2017-03-25 DIAGNOSIS — F0390 Unspecified dementia without behavioral disturbance: Secondary | ICD-10-CM | POA: Diagnosis not present

## 2017-03-25 DIAGNOSIS — C3490 Malignant neoplasm of unspecified part of unspecified bronchus or lung: Secondary | ICD-10-CM | POA: Diagnosis not present

## 2017-03-25 DIAGNOSIS — C7800 Secondary malignant neoplasm of unspecified lung: Secondary | ICD-10-CM | POA: Diagnosis not present

## 2017-04-07 DEATH — deceased
